# Patient Record
Sex: Female | Born: 1955
Health system: Southern US, Community
[De-identification: ages and names within clinical notes are randomized; demographics above are authoritative.]

## PROBLEM LIST (undated history)

## (undated) DIAGNOSIS — E119 Type 2 diabetes mellitus without complications: Secondary | ICD-10-CM

## (undated) DIAGNOSIS — E785 Hyperlipidemia, unspecified: Secondary | ICD-10-CM

## (undated) DIAGNOSIS — I219 Acute myocardial infarction, unspecified: Secondary | ICD-10-CM

## (undated) DIAGNOSIS — E079 Disorder of thyroid, unspecified: Secondary | ICD-10-CM

## (undated) DIAGNOSIS — F419 Anxiety disorder, unspecified: Secondary | ICD-10-CM

## (undated) DIAGNOSIS — G4700 Insomnia, unspecified: Secondary | ICD-10-CM

## (undated) DIAGNOSIS — I10 Essential (primary) hypertension: Secondary | ICD-10-CM

## (undated) HISTORY — DX: Acute myocardial infarction, unspecified: I21.9

## (undated) HISTORY — DX: Type 2 diabetes mellitus without complications: E11.9

## (undated) HISTORY — DX: Insomnia, unspecified: G47.00

## (undated) HISTORY — DX: Hyperlipidemia, unspecified: E78.5

## (undated) HISTORY — DX: Essential (primary) hypertension: I10

## (undated) HISTORY — PX: CARPAL TUNNEL RELEASE: SHX101

## (undated) HISTORY — DX: Anxiety disorder, unspecified: F41.9

## (undated) HISTORY — PX: CATARACT EXTRACTION: SUR2

## (undated) HISTORY — DX: Disorder of thyroid, unspecified: E07.9

---

## 1994-04-30 HISTORY — PX: TOTAL HIP ARTHROPLASTY: SHX124

## 2001-04-30 HISTORY — PX: ABDOMINAL HYSTERECTOMY: SHX81

## 2001-04-30 HISTORY — PX: LAPAROSCOPIC HYSTERECTOMY: SHX1926

## 2002-05-14 ENCOUNTER — Encounter: Admission: RE | Admit: 2002-05-14 | Discharge: 2002-05-14 | Payer: Self-pay | Admitting: Internal Medicine

## 2002-05-14 ENCOUNTER — Encounter: Payer: Self-pay | Admitting: Internal Medicine

## 2003-11-22 ENCOUNTER — Other Ambulatory Visit: Admission: RE | Admit: 2003-11-22 | Discharge: 2003-11-22 | Payer: Self-pay | Admitting: Obstetrics and Gynecology

## 2004-10-24 ENCOUNTER — Emergency Department (HOSPITAL_COMMUNITY): Admission: EM | Admit: 2004-10-24 | Discharge: 2004-10-24 | Payer: Self-pay | Admitting: Family Medicine

## 2005-11-26 ENCOUNTER — Encounter (HOSPITAL_COMMUNITY): Admission: RE | Admit: 2005-11-26 | Discharge: 2006-01-22 | Payer: Self-pay | Admitting: Family Medicine

## 2005-12-07 ENCOUNTER — Encounter: Admission: RE | Admit: 2005-12-07 | Discharge: 2005-12-07 | Payer: Self-pay | Admitting: Family Medicine

## 2006-01-15 ENCOUNTER — Encounter: Admission: RE | Admit: 2006-01-15 | Discharge: 2006-01-15 | Payer: Self-pay | Admitting: Family Medicine

## 2006-01-15 ENCOUNTER — Other Ambulatory Visit: Admission: RE | Admit: 2006-01-15 | Discharge: 2006-01-15 | Payer: Self-pay | Admitting: Interventional Radiology

## 2006-01-15 ENCOUNTER — Encounter (INDEPENDENT_AMBULATORY_CARE_PROVIDER_SITE_OTHER): Payer: Self-pay | Admitting: *Deleted

## 2007-06-06 ENCOUNTER — Encounter: Payer: Self-pay | Admitting: Endocrinology

## 2008-11-18 ENCOUNTER — Ambulatory Visit: Payer: Self-pay | Admitting: Emergency Medicine

## 2008-11-30 ENCOUNTER — Ambulatory Visit: Payer: Self-pay | Admitting: Emergency Medicine

## 2010-05-21 ENCOUNTER — Encounter: Payer: Self-pay | Admitting: Family Medicine

## 2013-01-19 ENCOUNTER — Encounter: Payer: Self-pay | Admitting: Obstetrics & Gynecology

## 2013-02-09 ENCOUNTER — Ambulatory Visit (INDEPENDENT_AMBULATORY_CARE_PROVIDER_SITE_OTHER): Admitting: Obstetrics & Gynecology

## 2013-02-09 ENCOUNTER — Encounter: Payer: Self-pay | Admitting: Obstetrics & Gynecology

## 2013-02-09 VITALS — BP 110/68 | HR 74 | Temp 97.5°F | Wt 135.0 lb

## 2013-02-09 DIAGNOSIS — Z0000 Encounter for general adult medical examination without abnormal findings: Secondary | ICD-10-CM

## 2013-02-09 DIAGNOSIS — R3915 Urgency of urination: Secondary | ICD-10-CM

## 2013-02-09 DIAGNOSIS — Z01419 Encounter for gynecological examination (general) (routine) without abnormal findings: Secondary | ICD-10-CM

## 2013-02-09 LAB — POCT URINALYSIS DIPSTICK
Glucose, UA: NEGATIVE
Leukocytes, UA: NEGATIVE
Nitrite, UA: NEGATIVE
Protein, UA: NEGATIVE
Spec Grav, UA: <=1.005
Urobilinogen, UA: NEGATIVE

## 2013-02-09 LAB — POCT WET PREP (WET MOUNT): Clue Cells Wet Prep Whiff POC: NEGATIVE

## 2013-02-09 MED ORDER — SULFAMETHOXAZOLE-TMP DS 800-160 MG PO TABS: 1.0000 | ORAL_TABLET | Freq: Two times a day (BID) | ORAL | Status: DC

## 2013-02-09 NOTE — Patient Instructions (Signed)
Urinary Tract Infection  Urinary tract infections (UTIs) can develop anywhere along your urinary tract. Your urinary tract is your body's drainage system for removing wastes and extra water. Your urinary tract includes two kidneys, two ureters, a bladder, and a urethra. Your kidneys are a pair of bean-shaped organs. Each kidney is about the size of your fist. They are located below your ribs, one on each side of your spine.  CAUSES  Infections are caused by microbes, which are microscopic organisms, including fungi, viruses, and bacteria. These organisms are so small that they can only be seen through a microscope. Bacteria are the microbes that most commonly cause UTIs.  SYMPTOMS   Symptoms of UTIs may vary by age and gender of the patient and by the location of the infection. Symptoms in young women typically include a frequent and intense urge to urinate and a painful, burning feeling in the bladder or urethra during urination. Older women and men are more likely to be tired, shaky, and weak and have muscle aches and abdominal pain. A fever may mean the infection is in your kidneys. Other symptoms of a kidney infection include pain in your back or sides below the ribs, nausea, and vomiting.  DIAGNOSIS  To diagnose a UTI, your caregiver will ask you about your symptoms. Your caregiver also will ask to provide a urine sample. The urine sample will be tested for bacteria and white blood cells. White blood cells are made by your body to help fight infection.  TREATMENT   Typically, UTIs can be treated with medication. Because most UTIs are caused by a bacterial infection, they usually can be treated with the use of antibiotics. The choice of antibiotic and length of treatment depend on your symptoms and the type of bacteria causing your infection.  HOME CARE INSTRUCTIONS   If you were prescribed antibiotics, take them exactly as your caregiver instructs you. Finish the medication even if you feel better after you  have only taken some of the medication.   Drink enough water and fluids to keep your urine clear or pale yellow.   Avoid caffeine, tea, and carbonated beverages. They tend to irritate your bladder.   Empty your bladder often. Avoid holding urine for long periods of time.   Empty your bladder before and after sexual intercourse.   After a bowel movement, women should cleanse from front to back. Use each tissue only once.  SEEK MEDICAL CARE IF:    You have back pain.   You develop a fever.   Your symptoms do not begin to resolve within 3 days.  SEEK IMMEDIATE MEDICAL CARE IF:    You have severe back pain or lower abdominal pain.   You develop chills.   You have nausea or vomiting.   You have continued burning or discomfort with urination.  MAKE SURE YOU:    Understand these instructions.   Will watch your condition.   Will get help right away if you are not doing well or get worse.  Document Released: 01/24/2005 Document Revised: 10/16/2011 Document Reviewed: 05/25/2011  [employer] Patient Information 2014 [employer].

## 2013-02-09 NOTE — Progress Notes (Signed)
Subjective:     Andrea Riley is a 58 y.o. female here for a routine exam.  Current complaints: annual exam. Pt states she is having a yellow crusty like discharge. Pt states she has a slight odor.   Personal health questionnaire reviewed: yes.   Gynecologic History No LMP recorded. Patient has had a hysterectomy. Contraception: status post hysterectomy Last Pap : 04/2012 Results were normal Last mammogram: 2013 . Results were: normal  Obstetric History OB History  No data available     The following portions of the patient's history were reviewed and updated as appropriate: allergies, current medications, past family history, past medical history, past social history, past surgical history and problem list.  Review of Systems Pertinent items are noted in HPI.    Objective:    General appearance: alert Breasts: normal appearance, no masses or tenderness Abdomen: soft, non-tender; bowel sounds normal; no masses,  no organomegaly Pelvic:  external genitalia normal, no adnexal masses or tenderness and vagina normal without discharge    Assessment:   UTI symptoms  Plan:   Treat empirically for a UTI  Testing for a possible vaginitis Return prn

## 2013-02-10 LAB — URINALYSIS, ROUTINE W REFLEX MICROSCOPIC
Glucose, UA: NEGATIVE mg/dL
Hgb urine dipstick: NEGATIVE
Leukocytes, UA: NEGATIVE
Nitrite: NEGATIVE
Protein, ur: NEGATIVE mg/dL
Urobilinogen, UA: 0.2 mg/dL (ref 0.0–1.0)

## 2013-02-12 ENCOUNTER — Encounter: Payer: Self-pay | Admitting: Obstetrics & Gynecology

## 2013-02-12 LAB — URINE CULTURE

## 2013-02-16 ENCOUNTER — Encounter: Payer: Self-pay | Admitting: Obstetrics

## 2013-02-16 ENCOUNTER — Encounter: Payer: Self-pay | Admitting: Obstetrics & Gynecology

## 2013-08-13 ENCOUNTER — Ambulatory Visit: Admitting: Obstetrics & Gynecology

## 2013-08-21 ENCOUNTER — Encounter: Payer: Self-pay | Admitting: Obstetrics & Gynecology

## 2014-04-26 ENCOUNTER — Encounter: Payer: Self-pay | Admitting: *Deleted

## 2014-04-27 ENCOUNTER — Encounter: Payer: Self-pay | Admitting: Obstetrics & Gynecology

## 2014-06-15 ENCOUNTER — Other Ambulatory Visit: Payer: Self-pay | Admitting: *Deleted

## 2014-06-15 DIAGNOSIS — Z1231 Encounter for screening mammogram for malignant neoplasm of breast: Secondary | ICD-10-CM

## 2016-12-19 ENCOUNTER — Encounter: Payer: Self-pay | Admitting: Gastroenterology

## 2017-01-24 ENCOUNTER — Other Ambulatory Visit: Payer: Self-pay

## 2017-02-07 ENCOUNTER — Ambulatory Visit (INDEPENDENT_AMBULATORY_CARE_PROVIDER_SITE_OTHER): Admitting: Gastroenterology

## 2017-02-07 ENCOUNTER — Encounter: Payer: Self-pay | Admitting: Gastroenterology

## 2017-02-07 VITALS — BP 120/68 | HR 64 | Ht 62.5 in | Wt 155.0 lb

## 2017-02-07 DIAGNOSIS — R140 Abdominal distension (gaseous): Secondary | ICD-10-CM

## 2017-02-07 DIAGNOSIS — R1031 Right lower quadrant pain: Secondary | ICD-10-CM

## 2017-02-07 DIAGNOSIS — Z800 Family history of malignant neoplasm of digestive organs: Secondary | ICD-10-CM | POA: Diagnosis not present

## 2017-02-07 MED ORDER — NA SULFATE-K SULFATE-MG SULF 17.5-3.13-1.6 GM/177ML PO SOLN: 1.0000 | Freq: Once | ORAL | 0 refills | Status: AC

## 2017-02-07 NOTE — Patient Instructions (Signed)
If you are age 63 or older, your body mass index should be between 23-30. Your Body mass index is 27.9 kg/m. If this is out of the aforementioned range listed, please consider follow up with your Primary Care Provider.  If you are age 16 or younger, your body mass index should be between 19-25. Your Body mass index is 27.9 kg/m. If this is out of the aformentioned range listed, please consider follow up with your Primary Care Provider.   You have been scheduled for an endoscopy and colonoscopy. Please follow the written instructions given to you at your visit today. Please pick up your prep supplies at the pharmacy within the next 1-3 days. If you use inhalers (even only as needed), please bring them with you on the day of your procedure. Your physician has requested that you go to www.startemmi.com and enter the access code given to you at your visit today. This web site gives a general overview about your procedure. However, you should still follow specific instructions given to you by our office regarding your preparation for the procedure.  Thank you for choosing [facility]  Dr Amada Jupiter III

## 2017-02-07 NOTE — Progress Notes (Signed)
[facility] Consult Note:  History: Andrea Riley 02/07/2017  Referring physician: Marva Panda, NP  Reason for consult/chief complaint: Abdominal Pain (LRQ with swelling and bloating)   Subjective  HPI:  This is a delightful 61 year old woman referred by primary care for right lower quadrant pain. It has been occurring for many years, more so in the last 3-5. She feels it as a brief acute onset sharp pain "like a knife" that might radiate slightly to the hip or the right flank. It might be worse with certain movements, and he gets better with Advil. She denies exacerbation by bowel movements or eating. She does tend toward abdominal bloating for many years, and consumes a high-fiber diet with additional fiber supplements. She has tried a lactose-free diet without much improvement in the bloating. Her bowel habits are regular without rectal bleeding.Sumaiyah recalls seeing a physician in [place] for this pain many years ago and was told she might have a hernia. She also believes her last colonoscopy was many years ago. There's been some concern on her part about the nature of the symptoms because her mother was diagnosed with stomach cancer at age 54. Kyannah believes her last gynecology visit was several years ago, and says that primary care does her Pap smears. She was not sure that she needed gynecology follow-up because she has had a hysterectomy and oophorectomy many years ago.  ROS:  Review of Systems  Constitutional: Negative for appetite change and unexpected weight change.  HENT: Negative for mouth sores and voice change.   Eyes: Negative for pain and redness.  Respiratory: Negative for cough and shortness of breath.   Cardiovascular: Negative for chest pain and palpitations.  Genitourinary: Negative for dysuria and hematuria.  Musculoskeletal: Negative for arthralgias and myalgias.  Skin: Negative for pallor and rash.  Neurological: Negative for  weakness and headaches.  Hematological: Negative for adenopathy.     Past Medical History: Past Medical History:  Diagnosis Date  . Anxiety   . Hyperlipemia   . Hypertension   . Insomnia   . Thyroid disease    Hypothyroidism     Past Surgical History: Past Surgical History:  Procedure Laterality Date  . ABDOMINAL HYSTERECTOMY  2003  . CARPAL TUNNEL RELEASE Bilateral 08/2011 & 04/2012  . CATARACT EXTRACTION    . CESAREAN SECTION    . TOTAL HIP ARTHROPLASTY Right 1996   She believes ovaries removed as well. Emergency C section  - "wasn't pretty"  Family History: Family History  Problem Relation Age of Onset  . Diabetes Mother   . Heart disease Mother   . Hypertension Mother   . Cancer Mother   . Thyroid disease Mother   . Hypertension Father     Social History: Social History   Social History  . Marital status: Married    Spouse name: N/A  . Number of children: N/A  . Years of education: N/A   Social History Main Topics  . Smoking status: Current Every Day Smoker    Packs/day: 0.25    Years: 24.00  . Smokeless tobacco: Never Used  . Alcohol use No  . Drug use: No  . Sexual activity: Not Currently   Other Topics Concern  . None   Social History Narrative  . None    Allergies: Allergies  Allergen Reactions  . Latex Itching  . Penicillins Swelling and Other (See Comments)    Itching  . Vicodin [Hydrocodone-Acetaminophen] Other (See Comments)    Hallucinations  Outpatient Meds: Current Outpatient Prescriptions  Medication Sig Dispense Refill  . ALPRAZolam (XANAX) 1 MG tablet Take 1 mg by mouth at bedtime as needed for sleep.    Marland Kitchen atorvastatin (LIPITOR) 20 MG tablet Take 1 tablet by mouth daily.    Marland Kitchen esomeprazole (NEXIUM) 40 MG capsule Take 40 mg by mouth daily before breakfast.    . sertraline (ZOLOFT) 50 MG tablet Take 50 mg by mouth daily.    Marland Kitchen sulfamethoxazole-trimethoprim (BACTRIM DS) 800-160 MG per tablet Take 1 tablet by mouth 2  (two) times daily. 14 tablet 0  . valsartan-hydrochlorothiazide (DIOVAN HCT) 80-12.5 MG per tablet Take 1 tablet by mouth daily.    Marland Kitchen zolpidem (AMBIEN) 10 MG tablet Take 10 mg by mouth at bedtime as needed for sleep.     No current facility-administered medications for this visit.       ___________________________________________________________________ Objective   Exam:  BP 120/68   Pulse 64   Ht 5' 2.5" (1.588 m)   Wt 155 lb (70.3 kg)   BMI 27.90 kg/m    General: this is a(n) well-appearing woman   Eyes: sclera anicteric, no redness  ENT: oral mucosa moist without lesions, no cervical or supraclavicular lymphadenopathy, good dentition  CV: RRR without murmur, S1/S2, no JVD, no peripheral edema  Resp: clear to auscultation bilaterally, normal RR and effort noted  GI: soft, no tenderness, with active bowel sounds. No guarding or palpable organomegaly noted.there is no abdominal wall hernia supine or standing  Skin; warm and dry, no rash or jaundice noted  Neuro: awake, alert and oriented x 3. Normal gross motor function and fluent speech  Labs:  August nml UA, CMP, CBC/diff  No radiologic evaluation  Assessment: Encounter Diagnoses  Name Primary?  . RLQ abdominal pain Yes  . Abdominal bloating   . Family history- stomach cancer     The pain is somewhat difficult to characterize. I am not certain if it is really GI in origin given her description. She wonders if it could be adhesions from prior surgery, but I do not think we have any way of knowing that with certainty.She has had no history of bowel obstruction, and the pain does not have associated nausea or vomiting. I reassured her that this pain is not consistent with gastric cancer. Nevertheless her family history warrants an upper endoscopy to screen for intestinal metaplasia or H. Pylori as possible risk factors for gastric cancer.  Plan:  EGD and colonoscopy, she is agreeable.  The benefits and risks  of the planned procedure were described in detail with the patient or (when appropriate) their health care proxy.  Risks were outlined as including, but not limited to, bleeding, infection, perforation, adverse medication reaction leading to cardiac or pulmonary decompensation, or pancreatitis (if ERCP).  The limitation of incomplete mucosal visualization was also discussed.  No guarantees or warranties were given.   Kita feels that if it can be determined that the symptoms were not harmful cause, she would be reassured and could definitely live with it.  Thank you for the courtesy of this consult.  Please call me with any questions or concerns.  Charlie Pitter III  CC: Marva Panda, NP

## 2017-03-11 ENCOUNTER — Ambulatory Visit (AMBULATORY_SURGERY_CENTER): Admitting: Gastroenterology

## 2017-03-11 ENCOUNTER — Encounter: Payer: Self-pay | Admitting: Gastroenterology

## 2017-03-11 ENCOUNTER — Other Ambulatory Visit: Payer: Self-pay

## 2017-03-11 VITALS — BP 137/66 | HR 62 | Temp 96.8°F | Resp 14 | Ht 62.5 in | Wt 155.0 lb

## 2017-03-11 DIAGNOSIS — Z800 Family history of malignant neoplasm of digestive organs: Secondary | ICD-10-CM | POA: Diagnosis not present

## 2017-03-11 DIAGNOSIS — R1031 Right lower quadrant pain: Secondary | ICD-10-CM | POA: Diagnosis not present

## 2017-03-11 MED ORDER — SODIUM CHLORIDE 0.9 % IV SOLN: 500.0000 mL | INTRAVENOUS | Status: DC

## 2017-03-11 NOTE — Patient Instructions (Signed)
YOU HAD AN ENDOSCOPIC PROCEDURE TODAY AT THE [facility]:   Refer to the procedure report that was given to you for any specific questions about what was found during the examination.  If the procedure report does not answer your questions, please call your gastroenterologist to clarify.  If you requested that your care partner not be given the details of your procedure findings, then the procedure report has been included in a sealed envelope for you to review at your convenience later.  YOU SHOULD EXPECT: Some feelings of bloating in the abdomen. Passage of more gas than usual.  Walking can help get rid of the air that was put into your GI tract during the procedure and reduce the bloating. If you had a lower endoscopy (such as a colonoscopy or flexible sigmoidoscopy) you may notice spotting of blood in your stool or on the toilet paper. If you underwent a bowel prep for your procedure, you may not have a normal bowel movement for a few days.  Please Note:  You might notice some irritation and congestion in your nose or some drainage.  This is from the oxygen used during your procedure.  There is no need for concern and it should clear up in a day or so.  SYMPTOMS TO REPORT IMMEDIATELY:   Following lower endoscopy (colonoscopy or flexible sigmoidoscopy):  Excessive amounts of blood in the stool  Significant tenderness or worsening of abdominal pains  Swelling of the abdomen that is new, acute  Fever of 100F or higher   Following upper endoscopy (EGD)  Vomiting of blood or coffee ground material  New chest pain or pain under the shoulder blades  Painful or persistently difficult swallowing  New shortness of breath  Fever of 100F or higher  Black, tarry-looking stools  For urgent or emergent issues, a gastroenterologist can be reached at any hour by calling [phone].   DIET:  We do recommend a small meal at first, but then you may proceed to your regular diet.  Drink  plenty of fluids but you should avoid alcoholic beverages for 24 hours.  ACTIVITY:  You should plan to take it easy for the rest of today and you should NOT DRIVE or use heavy machinery until tomorrow (because of the sedation medicines used during the test).    FOLLOW UP: Our staff will call the number listed on your records the next business day following your procedure to check on you and address any questions or concerns that you may have regarding the information given to you following your procedure. If we do not reach you, we will leave a message.  However, if you are feeling well and you are not experiencing any problems, there is no need to return our call.  We will assume that you have returned to your regular daily activities without incident.  If any biopsies were taken you will be contacted by phone or by letter within the next 1-3 weeks.  Please call us at [phone] if you have not heard about the biopsies in 3 weeks.   Await for results Repeat Colonoscopy screening in 10 years Diverticulosis (handout given)   SIGNATURES/CONFIDENTIALITY: You and/or your care partner have signed paperwork which will be entered into your electronic medical record.  These signatures attest to the fact that that the information above on your After Visit Summary has been reviewed and is understood.  Full responsibility of the confidentiality of this discharge information lies with you and/or your  care-partner.

## 2017-03-11 NOTE — Progress Notes (Signed)
Dental advisory given to patient 

## 2017-03-11 NOTE — Op Note (Signed)
[facility] Patient Name: Andrea DadaSabrina Crawshaw Procedure Date: 03/11/2017 1:50 PM MRN: [id number] Endoscopist: Sherilyn CooterHenry L. Myrtie Neitheranis , MD Age: [age] Referring MD:  Date of Birth: July 23, 1955 Gender: Female Account #: [id number] Procedure:                Upper GI endoscopy Indications:              Screening procedure (mother had gastric cancer) Medicines:                Monitored Anesthesia Care Procedure:                Pre-Anesthesia Assessment:                           - Prior to the procedure, a History and Physical                            was performed, and patient medications and                            allergies were reviewed. The patient's tolerance of                            previous anesthesia was also reviewed. The risks                            and benefits of the procedure and the sedation                            options and risks were discussed with the patient.                            All questions were answered, and informed consent                            was obtained. Prior Anticoagulants: The patient has                            taken no previous anticoagulant or antiplatelet                            agents. ASA Grade Assessment: II - A patient with                            mild systemic disease. After reviewing the risks                            and benefits, the patient was deemed in                            satisfactory condition to undergo the procedure.                           After obtaining informed consent, the endoscope was  passed under direct vision. Throughout the                            procedure, the patient's blood pressure, pulse, and                            oxygen saturations were monitored continuously. The                            Endoscope was introduced through the mouth, and                            advanced to the second part of duodenum. The upper                            GI  endoscopy was accomplished without difficulty.                            The patient tolerated the procedure well. Scope In: Scope Out: Findings:                 The esophagus was normal.                           The entire examined stomach was normal. Biopsies                            were taken with a cold forceps for Helicobacter                            pylori testing using CLOtest.                           The cardia and gastric fundus were normal on                            retroflexion.                           The examined duodenum was normal. Complications:            No immediate complications. Estimated Blood Loss:     Estimated blood loss: none. Impression:               - Normal esophagus.                           - Normal stomach. Biopsied.                           - Normal examined duodenum. Recommendation:           - Patient has a contact number available for                            emergencies. The signs and symptoms of potential  delayed complications were discussed with the                            patient. Return to normal activities tomorrow.                            Written discharge instructions were provided to the                            patient.                           - Resume previous diet.                           - Continue present medications.                           - Await pathology results.                           - See the other procedure note for documentation of                            additional recommendations. Elian Gloster L. Myrtie Neither, MD 03/11/2017 2:33:48 PM This report has been signed electronically.

## 2017-03-11 NOTE — Progress Notes (Signed)
Called to room to assist during endoscopic procedure.  Patient ID and intended procedure confirmed with present staff. Received instructions for my participation in the procedure from the performing physician.  

## 2017-03-11 NOTE — Op Note (Signed)
[facility] Patient Name: Andrea DadaSabrina Simson Procedure Date: 03/11/2017 1:50 PM MRN: [id number] Endoscopist: Sherilyn CooterHenry L. Myrtie Neitheranis , MD Age: 61 Referring MD:  Date of Birth: Aug 19, 1955 Gender: Female Account #: [id number] Procedure:                Colonoscopy Indications:              Abdominal pain in the right lower quadrant Medicines:                Monitored Anesthesia Care Procedure:                Pre-Anesthesia Assessment:                           - Prior to the procedure, a History and Physical                            was performed, and patient medications and                            allergies were reviewed. The patient's tolerance of                            previous anesthesia was also reviewed. The risks                            and benefits of the procedure and the sedation                            options and risks were discussed with the patient.                            All questions were answered, and informed consent                            was obtained. Prior Anticoagulants: The patient has                            taken no previous anticoagulant or antiplatelet                            agents. ASA Grade Assessment: II - A patient with                            mild systemic disease. After reviewing the risks                            and benefits, the patient was deemed in                            satisfactory condition to undergo the procedure.                           After obtaining informed consent, the colonoscope  was passed under direct vision. Throughout the                            procedure, the patient's blood pressure, pulse, and                            oxygen saturations were monitored continuously. The                            Colonoscope was introduced through the anus and                            advanced to the the cecum, identified by                            appendiceal orifice and  ileocecal valve. The                            colonoscopy was performed with moderate difficulty                            due to significant looping and a tortuous colon.                            Successful completion of the procedure was aided by                            changing the patient to a supine position and using                            manual pressure. The patient tolerated the                            procedure well. The quality of the bowel                            preparation was evaluated using the BBPS [facility]                            Bowel Preparation Scale) with scores of: Right                            Colon = 3, Transverse Colon = 3 and Left Colon = 3                            (entire mucosa seen well with no residual staining,                            small fragments of stool or opaque liquid). The                            total BBPS score equals 9. The bowel preparation  used was SUPREP. The ileocecal valve, appendiceal                            orifice, and rectum were photographed. The quality                            of the bowel preparation was excellent. [Anatomical                            Structures]. Scope In: 2:06:00 PM Scope Out: 2:25:38 PM Scope Withdrawal Time: 0 hours 7 minutes 4 seconds  Total Procedure Duration: 0 hours 19 minutes 38 seconds  Findings:                 The perianal and digital rectal examinations were                            normal.                           Multiple diverticula were found in the sigmoid                            colon.                           The sigmoid colon was moderately redundant.                           Retroflexion in the rectum was not performed due to                            anatomy.                           The exam was otherwise without abnormality. Complications:            No immediate complications. Estimated Blood Loss:     Estimated  blood loss: none. Impression:               - Diverticulosis in the sigmoid colon.                           - Redundant colon.                           - The examination was otherwise normal.                           - No specimens collected.                           No cause seen for the episodic brief, sharp pain                            reported. May be referred from right hip or related  to adhesions from prior surgery. Recommendation:           - Patient has a contact number available for                            emergencies. The signs and symptoms of potential                            delayed complications were discussed with the                            patient. Return to normal activities tomorrow.                            Written discharge instructions were provided to the                            patient.                           - Resume previous diet.                           - Continue present medications.                           - Repeat colonoscopy in 10 years for screening                            purposes. Daviel Allegretto L. Myrtie Neitheranis, MD 03/11/2017 2:37:53 PM This report has been signed electronically.

## 2017-03-12 ENCOUNTER — Telehealth: Payer: Self-pay

## 2017-03-12 LAB — HELICOBACTER PYLORI SCREEN-BIOPSY: UREASE: NEGATIVE

## 2017-03-12 NOTE — Telephone Encounter (Signed)
  Follow up Call-  Call back number 03/11/2017  Post procedure Call Back phone  # [phone]  Permission to leave phone message No  Some recent data might be hidden     Patient questions:  Do you have a fever, pain , or abdominal swelling? No. Pain Score  0 *  Have you tolerated food without any problems? Yes.    Have you been able to return to your normal activities? Yes.    Do you have any questions about your discharge instructions: Diet   No. Medications  No. Follow up visit  No.  Do you have questions or concerns about your Care? No.  Actions: * If pain score is 4 or above: No action needed, pain <4.

## 2017-03-14 ENCOUNTER — Encounter: Payer: Self-pay | Admitting: Gastroenterology

## 2018-05-26 ENCOUNTER — Other Ambulatory Visit: Payer: Self-pay | Admitting: *Deleted

## 2018-05-26 DIAGNOSIS — Z1231 Encounter for screening mammogram for malignant neoplasm of breast: Secondary | ICD-10-CM

## 2018-06-25 ENCOUNTER — Ambulatory Visit
Admission: RE | Admit: 2018-06-25 | Discharge: 2018-06-25 | Disposition: A | Discharge status: Home / Self Care | Source: Ambulatory Visit | Attending: *Deleted | Admitting: *Deleted

## 2018-06-25 DIAGNOSIS — Z1231 Encounter for screening mammogram for malignant neoplasm of breast: Secondary | ICD-10-CM

## 2018-06-26 ENCOUNTER — Other Ambulatory Visit: Payer: Self-pay | Admitting: *Deleted

## 2018-06-26 DIAGNOSIS — R928 Other abnormal and inconclusive findings on diagnostic imaging of breast: Secondary | ICD-10-CM

## 2018-07-02 ENCOUNTER — Ambulatory Visit

## 2018-07-02 ENCOUNTER — Ambulatory Visit
Admission: RE | Admit: 2018-07-02 | Discharge: 2018-07-02 | Disposition: A | Discharge status: Home / Self Care | Source: Ambulatory Visit | Attending: *Deleted | Admitting: *Deleted

## 2018-07-02 DIAGNOSIS — R928 Other abnormal and inconclusive findings on diagnostic imaging of breast: Secondary | ICD-10-CM

## 2019-06-12 ENCOUNTER — Other Ambulatory Visit: Payer: Self-pay | Admitting: *Deleted

## 2019-06-12 DIAGNOSIS — M810 Age-related osteoporosis without current pathological fracture: Secondary | ICD-10-CM

## 2019-09-04 ENCOUNTER — Other Ambulatory Visit: Payer: Self-pay | Admitting: *Deleted

## 2019-09-04 DIAGNOSIS — Z1231 Encounter for screening mammogram for malignant neoplasm of breast: Secondary | ICD-10-CM

## 2019-09-04 DIAGNOSIS — Z13820 Encounter for screening for osteoporosis: Secondary | ICD-10-CM

## 2019-09-04 DIAGNOSIS — E2839 Other primary ovarian failure: Secondary | ICD-10-CM

## 2019-11-25 ENCOUNTER — Other Ambulatory Visit: Payer: Self-pay

## 2019-11-25 ENCOUNTER — Ambulatory Visit
Admission: RE | Admit: 2019-11-25 | Discharge: 2019-11-25 | Disposition: A | Discharge status: Home / Self Care | Payer: Medicare Other | Source: Ambulatory Visit | Attending: *Deleted | Admitting: *Deleted

## 2019-11-25 DIAGNOSIS — E2839 Other primary ovarian failure: Secondary | ICD-10-CM

## 2019-11-25 DIAGNOSIS — Z1231 Encounter for screening mammogram for malignant neoplasm of breast: Secondary | ICD-10-CM

## 2019-12-30 ENCOUNTER — Other Ambulatory Visit: Payer: Medicare Other

## 2019-12-30 ENCOUNTER — Ambulatory Visit (INDEPENDENT_AMBULATORY_CARE_PROVIDER_SITE_OTHER): Payer: Medicare Other | Admitting: Gastroenterology

## 2019-12-30 ENCOUNTER — Encounter: Payer: Self-pay | Admitting: Gastroenterology

## 2019-12-30 VITALS — BP 118/60 | HR 71 | Ht 62.5 in | Wt 153.0 lb

## 2019-12-30 DIAGNOSIS — R1031 Right lower quadrant pain: Secondary | ICD-10-CM

## 2019-12-30 DIAGNOSIS — R1011 Right upper quadrant pain: Secondary | ICD-10-CM | POA: Diagnosis not present

## 2019-12-30 NOTE — Progress Notes (Signed)
[facility] Progress Note  Chief Complaint: Right lower and upper quadrant abdominal pain  Subjective  History:  Office visit and EGD/colonoscopy Nov 2018 for similar symptoms.  Colonoscopy notable for diverticulosis and tortuous, redundant colon.  Otherwise normal.  EGD for history of gastric cancer in her mother was normal, CLOtest negative.  Kaelei wanted to see me for reevaluation of the symptoms.  While the right lower quadrant pain is still episodic and usually dull, nonradiating with no clear relation to bowel movements eating or position, she is concerned because sometimes that discomfort feels like it extends into the right upper quadrant around the liver.  She says she can "feel something moving" under her rib cage.  She has episodic nausea.  Generally appetite is good and weight stable.  She still harbors concerns because of her mother's history of stomach cancer. The right upper quadrant discomfort is worse if she bends over, given her concerns that she might have a hernia.  She seem to recall that a physician in [place] told her she might have a hernia.  ROS: Cardiovascular:  no chest pain Respiratory: no dyspnea Mood stable on medicine Remainder systems negative except as above  The patient's Past Medical, Family and Social History were reviewed and are on file in the EMR.  Objective:  Med list reviewed  Current Outpatient Medications:  .  ALPRAZolam (XANAX) 1 MG tablet, Take 1 mg by mouth at bedtime as needed for sleep., Disp: , Rfl:  .  atorvastatin (LIPITOR) 20 MG tablet, Take 1 tablet by mouth daily., Disp: , Rfl:  .  esomeprazole (NEXIUM) 20 MG capsule, Take 40 mg by mouth daily at 12 noon. Sometimes takes a extra dose around 3pm, Disp: , Rfl:  .  fenofibrate (TRICOR) 145 MG tablet, Take 1 tablet by mouth daily., Disp: , Rfl:  .  sertraline (ZOLOFT) 50 MG tablet, Take 50 mg by mouth daily., Disp: , Rfl:  .  valsartan-hydrochlorothiazide (DIOVAN HCT)  80-12.5 MG per tablet, Take 1 tablet by mouth daily., Disp: , Rfl:    Vital signs in last 24 hrs: Vitals:   12/30/19 1055  BP: 118/60  Pulse: 71    Physical Exam  She is well-appearing, pleasant  HEENT: sclera anicteric, oral mucosa moist without lesions  Neck: supple, no thyromegaly, JVD or lymphadenopathy  Cardiac: RRR without murmurs, S1S2 heard, no peripheral edema  Pulm: clear to auscultation bilaterally, normal RR and effort noted  Abdomen: soft, no tenderness, with active bowel sounds. No guarding or palpable hepatosplenomegaly.  No visible or palpable abnormality right upper quadrant or right lower anterior/lateral chest wall  Skin; warm and dry, no jaundice or rash No visible or palpable abdominal wall hernias while standing or supine. Labs:   ___________________________________________ Radiologic studies:   ____________________________________________ Other:   _____________________________________________ Assessment & Plan  Assessment: Encounter Diagnoses  Name Primary?  . RUQ abdominal pain Yes  . Abdominal pain, RLQ    Pain is still not clearly digestive, possibly musculoskeletal. She has ongoing concerns about it, at least in part related to family history of malignancy.   Plan: I offered her a CT scan abdomen and pelvis for further evaluation (ultrasound would not be a detailed enough imaging study to answer this question).   She was enthusiastic about doing that, and felt it would give her great reassurance.   30 minutes were spent on this encounter (including chart review, history/exam, counseling/coordination of care, and documentation)  Charlie Pitter III

## 2019-12-30 NOTE — Patient Instructions (Signed)
If you are age 66 or older, your body mass index should be between 23-30. Your Body mass index is 27.54 kg/m. If this is out of the aforementioned range listed, please consider follow up with your Primary Care Provider.  If you are age 14 or younger, your body mass index should be between 19-25. Your Body mass index is 27.54 kg/m. If this is out of the aformentioned range listed, please consider follow up with your Primary Care Provider.   Your provider has requested that you go to the basement level for lab work before leaving today. Press "B" on the elevator. The lab is located at the first door on the left as you exit the elevator.  Due to recent changes in healthcare laws, you may see the results of your imaging and laboratory studies on MyChart before your provider has had a chance to review them.  We understand that in some cases there may be results that are confusing or concerning to you. Not all laboratory results come back in the same time frame and the provider may be waiting for multiple results in order to interpret others.  Please give [place] 48 hours in order for your provider to thoroughly review all the results before contacting the office for clarification of your results.    You have been scheduled for a CT scan of the abdomen and pelvis at [facility], 1st floor Radiology. You are scheduled on 12-11-2019  at [place] should arrive 15 minutes prior to your appointment time for registration.  Please pick up 2 bottles of contrast from [facility] at least 3 days prior to your scan, but today if possible. The solution may taste better if refrigerated, but do NOT add ice or any other liquid to this solution. Shake well before drinking.   Please follow the written instructions below on the day of your exam:   1) Do not eat anything after 4am (4 hours prior to your test)   2) Drink 1 bottle of contrast @ 6am (2 hours prior to your exam)  Remember to shake well before drinking and do  NOT pour over ice.     Drink 1 bottle of contrast @ 7am (1 hour prior to your exam)   You may take any medications as prescribed with a small amount of water, if necessary. If you take any of the following medications: METFORMIN, GLUCOPHAGE, GLUCOVANCE, AVANDAMET, RIOMET, FORTAMET, [place] MET, JANUMET, GLUMETZA or METAGLIP, you MAY be asked to HOLD this medication 48 hours AFTER the exam.   The purpose of you drinking the oral contrast is to aid in the visualization of your intestinal tract. The contrast solution may cause some diarrhea. Depending on your individual set of symptoms, you may also receive an intravenous injection of x-ray contrast/dye. Plan on being at [facility] for 45 minutes or longer, depending on the type of exam you are having performed.   If you have any questions regarding your exam or if you need to reschedule, you may call [facility] at [phone] between the hours of 8:00 am and 5:00 pm, Monday-Friday.   It was a pleasure to see you today!  Dr. Loletha Carrow

## 2019-12-31 LAB — BUN/CREATININE RATIO
BUN/Creatinine Ratio: 15 (calc) (ref 6–22)
BUN: 17 mg/dL (ref 7–25)
Creat: 1.1 mg/dL — ABNORMAL HIGH (ref 0.50–0.99)
GFR, Est African American: 61 mL/min/{1.73_m2} (ref >=60)
GFR, Est Non African American: 52 mL/min/{1.73_m2} — ABNORMAL LOW (ref >=60)

## 2020-01-11 ENCOUNTER — Ambulatory Visit (HOSPITAL_COMMUNITY)
Admission: RE | Admit: 2020-01-11 | Discharge: 2020-01-11 | Disposition: A | Discharge status: Home / Self Care | Payer: Medicare Other | Source: Ambulatory Visit | Attending: Gastroenterology | Admitting: Gastroenterology

## 2020-01-11 ENCOUNTER — Other Ambulatory Visit: Payer: Self-pay

## 2020-01-11 DIAGNOSIS — R1031 Right lower quadrant pain: Secondary | ICD-10-CM | POA: Insufficient documentation

## 2020-01-11 DIAGNOSIS — R1011 Right upper quadrant pain: Secondary | ICD-10-CM | POA: Diagnosis not present

## 2020-01-11 MED ORDER — IOHEXOL 300 MG/ML  SOLN
100.0000 mL | Freq: Once | INTRAMUSCULAR | Status: AC | PRN
  Administered 2020-01-11: 100 mL via INTRAVENOUS

## 2020-03-03 ENCOUNTER — Encounter (HOSPITAL_COMMUNITY)
Admission: EM | Disposition: A | Discharge status: Home / Self Care | Payer: Self-pay | Source: Home / Self Care | Attending: Cardiovascular Disease

## 2020-03-03 ENCOUNTER — Emergency Department (HOSPITAL_COMMUNITY): Payer: Medicare Other

## 2020-03-03 ENCOUNTER — Inpatient Hospital Stay (HOSPITAL_COMMUNITY)
Admission: EM | Admit: 2020-03-03 | Discharge: 2020-03-04 | DRG: 246 | Disposition: A | Discharge status: Home / Self Care | Payer: Medicare Other | Attending: Cardiovascular Disease | Admitting: Cardiovascular Disease

## 2020-03-03 ENCOUNTER — Other Ambulatory Visit (HOSPITAL_COMMUNITY): Payer: Medicare Other

## 2020-03-03 ENCOUNTER — Other Ambulatory Visit: Payer: Self-pay

## 2020-03-03 ENCOUNTER — Encounter (HOSPITAL_COMMUNITY): Payer: Self-pay

## 2020-03-03 DIAGNOSIS — N179 Acute kidney failure, unspecified: Secondary | ICD-10-CM | POA: Diagnosis present

## 2020-03-03 DIAGNOSIS — I2510 Atherosclerotic heart disease of native coronary artery without angina pectoris: Secondary | ICD-10-CM | POA: Diagnosis present

## 2020-03-03 DIAGNOSIS — E039 Hypothyroidism, unspecified: Secondary | ICD-10-CM | POA: Diagnosis present

## 2020-03-03 DIAGNOSIS — Z880 Allergy status to penicillin: Secondary | ICD-10-CM

## 2020-03-03 DIAGNOSIS — E1110 Type 2 diabetes mellitus with ketoacidosis without coma: Secondary | ICD-10-CM | POA: Diagnosis present

## 2020-03-03 DIAGNOSIS — I214 Non-ST elevation (NSTEMI) myocardial infarction: Secondary | ICD-10-CM | POA: Diagnosis present

## 2020-03-03 DIAGNOSIS — R7303 Prediabetes: Secondary | ICD-10-CM | POA: Diagnosis not present

## 2020-03-03 DIAGNOSIS — F17210 Nicotine dependence, cigarettes, uncomplicated: Secondary | ICD-10-CM | POA: Diagnosis present

## 2020-03-03 DIAGNOSIS — Z20822 Contact with and (suspected) exposure to covid-19: Secondary | ICD-10-CM | POA: Diagnosis present

## 2020-03-03 DIAGNOSIS — E785 Hyperlipidemia, unspecified: Secondary | ICD-10-CM

## 2020-03-03 DIAGNOSIS — Z955 Presence of coronary angioplasty implant and graft: Secondary | ICD-10-CM

## 2020-03-03 DIAGNOSIS — Z79899 Other long term (current) drug therapy: Secondary | ICD-10-CM

## 2020-03-03 DIAGNOSIS — Z8249 Family history of ischemic heart disease and other diseases of the circulatory system: Secondary | ICD-10-CM

## 2020-03-03 DIAGNOSIS — I2111 ST elevation (STEMI) myocardial infarction involving right coronary artery: Secondary | ICD-10-CM | POA: Diagnosis not present

## 2020-03-03 DIAGNOSIS — I10 Essential (primary) hypertension: Secondary | ICD-10-CM

## 2020-03-03 DIAGNOSIS — Z833 Family history of diabetes mellitus: Secondary | ICD-10-CM | POA: Diagnosis not present

## 2020-03-03 DIAGNOSIS — Z91040 Latex allergy status: Secondary | ICD-10-CM | POA: Diagnosis not present

## 2020-03-03 DIAGNOSIS — E782 Mixed hyperlipidemia: Secondary | ICD-10-CM

## 2020-03-03 DIAGNOSIS — Z885 Allergy status to narcotic agent status: Secondary | ICD-10-CM | POA: Diagnosis not present

## 2020-03-03 HISTORY — PX: INTRAVASCULAR ULTRASOUND/IVUS: CATH118244

## 2020-03-03 HISTORY — PX: CORONARY/GRAFT ACUTE MI REVASCULARIZATION: CATH118305

## 2020-03-03 HISTORY — PX: LEFT HEART CATH AND CORONARY ANGIOGRAPHY: CATH118249

## 2020-03-03 LAB — CBC
HCT: 36.7 % (ref 36.0–46.0)
Hemoglobin: 11.9 g/dL — ABNORMAL LOW (ref 12.0–15.0)
MCH: 29.7 pg (ref 26.0–34.0)
MCHC: 32.4 g/dL (ref 30.0–36.0)
MCV: 91.5 fL (ref 80.0–100.0)
Platelets: 352 10*3/uL (ref 150–400)
RBC: 4.01 MIL/uL (ref 3.87–5.11)
RDW: 14.6 % (ref 11.5–15.5)
WBC: 14.5 10*3/uL — ABNORMAL HIGH (ref 4.0–10.5)
nRBC: 0 % (ref 0.0–0.2)

## 2020-03-03 LAB — RESPIRATORY PANEL BY RT PCR (FLU A&B, COVID)
Influenza A by PCR: NEGATIVE
Influenza B by PCR: NEGATIVE
SARS Coronavirus 2 by RT PCR: NEGATIVE

## 2020-03-03 LAB — POCT ACTIVATED CLOTTING TIME
Activated Clotting Time: 279 seconds
Activated Clotting Time: 290 seconds

## 2020-03-03 LAB — BASIC METABOLIC PANEL
Anion gap: 13 (ref 5–15)
BUN: 17 mg/dL (ref 8–23)
CO2: 24 mmol/L (ref 22–32)
Calcium: 9.2 mg/dL (ref 8.9–10.3)
Chloride: 99 mmol/L (ref 98–111)
Creatinine, Ser: 1.33 mg/dL — ABNORMAL HIGH (ref 0.44–1.00)
GFR, Estimated: 44 mL/min — ABNORMAL LOW (ref >60)
Glucose, Bld: 195 mg/dL — ABNORMAL HIGH (ref 70–99)
Potassium: 4.1 mmol/L (ref 3.5–5.1)
Sodium: 136 mmol/L (ref 135–145)

## 2020-03-03 LAB — GLUCOSE, CAPILLARY: Glucose-Capillary: 132 mg/dL — ABNORMAL HIGH (ref 70–99)

## 2020-03-03 LAB — MRSA PCR SCREENING: MRSA by PCR: NEGATIVE

## 2020-03-03 LAB — CBG MONITORING, ED: Glucose-Capillary: 166 mg/dL — ABNORMAL HIGH (ref 70–99)

## 2020-03-03 LAB — TROPONIN I (HIGH SENSITIVITY): Troponin I (High Sensitivity): 120 ng/L (ref <18)

## 2020-03-03 SURGERY — CORONARY/GRAFT ACUTE MI REVASCULARIZATION
Anesthesia: LOCAL

## 2020-03-03 MED ORDER — FENTANYL CITRATE (PF) 100 MCG/2ML IJ SOLN
INTRAMUSCULAR | Status: DC | PRN
  Administered 2020-03-03 (×2): 25 ug via INTRAVENOUS

## 2020-03-03 MED ORDER — HEPARIN SODIUM (PORCINE) 1000 UNIT/ML IJ SOLN
INTRAMUSCULAR | Status: AC
  Filled 2020-03-03: qty 1

## 2020-03-03 MED ORDER — SODIUM CHLORIDE 0.9% FLUSH: 3.0000 mL | INTRAVENOUS | Status: DC | PRN

## 2020-03-03 MED ORDER — ATORVASTATIN CALCIUM 80 MG PO TABS
80.0000 mg | ORAL_TABLET | Freq: Every day | ORAL | Status: DC
  Administered 2020-03-04: 80 mg via ORAL
  Filled 2020-03-03: qty 1

## 2020-03-03 MED ORDER — SODIUM CHLORIDE 0.9 % IV SOLN: INTRAVENOUS | Status: AC

## 2020-03-03 MED ORDER — ASPIRIN 300 MG RE SUPP
300.0000 mg | RECTAL | Status: DC
  Filled 2020-03-03: qty 1

## 2020-03-03 MED ORDER — NITROGLYCERIN 1 MG/10 ML FOR IR/CATH LAB
INTRA_ARTERIAL | Status: AC
  Filled 2020-03-03: qty 10

## 2020-03-03 MED ORDER — LIDOCAINE HCL (PF) 1 % IJ SOLN
INTRAMUSCULAR | Status: DC | PRN
  Administered 2020-03-03: 5 mL

## 2020-03-03 MED ORDER — CHLORHEXIDINE GLUCONATE CLOTH 2 % EX PADS
6.0000 | MEDICATED_PAD | Freq: Every day | CUTANEOUS | Status: DC
  Administered 2020-03-04: 6 via TOPICAL

## 2020-03-03 MED ORDER — VALSARTAN-HYDROCHLOROTHIAZIDE 80-12.5 MG PO TABS: 1.0000 | ORAL_TABLET | Freq: Every day | ORAL | Status: DC

## 2020-03-03 MED ORDER — NITROGLYCERIN 0.4 MG SL SUBL: 0.4000 mg | SUBLINGUAL_TABLET | SUBLINGUAL | Status: DC | PRN

## 2020-03-03 MED ORDER — ONDANSETRON HCL 4 MG/2ML IJ SOLN: 4.0000 mg | Freq: Four times a day (QID) | INTRAMUSCULAR | Status: DC | PRN

## 2020-03-03 MED ORDER — ACETAMINOPHEN 325 MG PO TABS: 650.0000 mg | ORAL_TABLET | ORAL | Status: DC | PRN

## 2020-03-03 MED ORDER — VERAPAMIL HCL 2.5 MG/ML IV SOLN
INTRAVENOUS | Status: DC | PRN
  Administered 2020-03-03: 10 mL via INTRA_ARTERIAL

## 2020-03-03 MED ORDER — HEPARIN BOLUS VIA INFUSION
4000.0000 [IU] | Freq: Once | INTRAVENOUS | Status: AC
  Administered 2020-03-03: 4000 [IU] via INTRAVENOUS
  Filled 2020-03-03: qty 4000

## 2020-03-03 MED ORDER — SODIUM CHLORIDE 0.9 % WEIGHT BASED INFUSION: 1.0000 mL/kg/h | INTRAVENOUS | Status: DC

## 2020-03-03 MED ORDER — SODIUM CHLORIDE 0.9 % IV SOLN: 250.0000 mL | INTRAVENOUS | Status: DC | PRN

## 2020-03-03 MED ORDER — HEPARIN SODIUM (PORCINE) 1000 UNIT/ML IJ SOLN
INTRAMUSCULAR | Status: DC | PRN
  Administered 2020-03-03: 8000 [IU] via INTRAVENOUS
  Administered 2020-03-03: 2000 [IU] via INTRAVENOUS

## 2020-03-03 MED ORDER — MIDAZOLAM HCL 2 MG/2ML IJ SOLN
INTRAMUSCULAR | Status: AC
  Filled 2020-03-03: qty 2

## 2020-03-03 MED ORDER — FENTANYL CITRATE (PF) 100 MCG/2ML IJ SOLN
INTRAMUSCULAR | Status: AC
  Filled 2020-03-03: qty 2

## 2020-03-03 MED ORDER — ATORVASTATIN CALCIUM 10 MG PO TABS: 20.0000 mg | ORAL_TABLET | Freq: Every day | ORAL | Status: DC

## 2020-03-03 MED ORDER — NITROGLYCERIN 1 MG/10 ML FOR IR/CATH LAB
INTRA_ARTERIAL | Status: DC | PRN
  Administered 2020-03-03 (×2): 200 ug via INTRACORONARY

## 2020-03-03 MED ORDER — LIDOCAINE HCL (PF) 1 % IJ SOLN
INTRAMUSCULAR | Status: AC
  Filled 2020-03-03: qty 30

## 2020-03-03 MED ORDER — MIDAZOLAM HCL 2 MG/2ML IJ SOLN
INTRAMUSCULAR | Status: DC | PRN
  Administered 2020-03-03: 2 mg via INTRAVENOUS

## 2020-03-03 MED ORDER — ASPIRIN 81 MG PO CHEW
81.0000 mg | CHEWABLE_TABLET | Freq: Every day | ORAL | Status: DC
  Administered 2020-03-04: 81 mg via ORAL
  Filled 2020-03-03: qty 1

## 2020-03-03 MED ORDER — HEPARIN (PORCINE) 25000 UT/250ML-% IV SOLN
850.0000 [IU]/h | INTRAVENOUS | Status: DC
  Administered 2020-03-03: 850 [IU]/h via INTRAVENOUS
  Filled 2020-03-03: qty 250

## 2020-03-03 MED ORDER — TIROFIBAN (AGGRASTAT) BOLUS VIA INFUSION
INTRAVENOUS | Status: DC | PRN
  Administered 2020-03-03: 1725 ug via INTRAVENOUS

## 2020-03-03 MED ORDER — SODIUM CHLORIDE 0.9% FLUSH
3.0000 mL | Freq: Two times a day (BID) | INTRAVENOUS | Status: DC
  Administered 2020-03-04: 3 mL via INTRAVENOUS

## 2020-03-03 MED ORDER — ONDANSETRON HCL 4 MG/2ML IJ SOLN
4.0000 mg | Freq: Once | INTRAMUSCULAR | Status: AC
  Administered 2020-03-03: 4 mg via INTRAVENOUS
  Filled 2020-03-03: qty 2

## 2020-03-03 MED ORDER — TIROFIBAN HCL IN NACL 5-0.9 MG/100ML-% IV SOLN
INTRAVENOUS | Status: AC
  Filled 2020-03-03: qty 100

## 2020-03-03 MED ORDER — ASPIRIN EC 81 MG PO TBEC: 81.0000 mg | DELAYED_RELEASE_TABLET | Freq: Every day | ORAL | Status: DC

## 2020-03-03 MED ORDER — ASPIRIN 81 MG PO CHEW
324.0000 mg | CHEWABLE_TABLET | Freq: Once | ORAL | Status: AC
  Administered 2020-03-03: 324 mg via ORAL
  Filled 2020-03-03: qty 4

## 2020-03-03 MED ORDER — TICAGRELOR 90 MG PO TABS
ORAL_TABLET | ORAL | Status: AC
  Filled 2020-03-03: qty 2

## 2020-03-03 MED ORDER — HYDRALAZINE HCL 20 MG/ML IJ SOLN: 10.0000 mg | INTRAMUSCULAR | Status: AC | PRN

## 2020-03-03 MED ORDER — HEPARIN (PORCINE) IN NACL 1000-0.9 UT/500ML-% IV SOLN
INTRAVENOUS | Status: AC
  Filled 2020-03-03: qty 1000

## 2020-03-03 MED ORDER — SODIUM CHLORIDE 0.9 % WEIGHT BASED INFUSION: 3.0000 mL/kg/h | INTRAVENOUS | Status: AC

## 2020-03-03 MED ORDER — SODIUM CHLORIDE 0.9% FLUSH
3.0000 mL | Freq: Two times a day (BID) | INTRAVENOUS | Status: DC
  Administered 2020-03-03: 3 mL via INTRAVENOUS

## 2020-03-03 MED ORDER — TICAGRELOR 90 MG PO TABS
ORAL_TABLET | ORAL | Status: DC | PRN
  Administered 2020-03-03: 180 mg via ORAL

## 2020-03-03 MED ORDER — TIROFIBAN HCL IN NACL 5-0.9 MG/100ML-% IV SOLN
INTRAVENOUS | Status: DC | PRN
  Administered 2020-03-03: 0.075 ug/kg/min via INTRAVENOUS

## 2020-03-03 MED ORDER — IOHEXOL 350 MG/ML SOLN
INTRAVENOUS | Status: DC | PRN
  Administered 2020-03-03: 120 mL

## 2020-03-03 MED ORDER — TICAGRELOR 90 MG PO TABS
90.0000 mg | ORAL_TABLET | Freq: Two times a day (BID) | ORAL | Status: DC
  Administered 2020-03-03 – 2020-03-04 (×2): 90 mg via ORAL
  Filled 2020-03-03 (×2): qty 1

## 2020-03-03 MED ORDER — HEPARIN (PORCINE) IN NACL 1000-0.9 UT/500ML-% IV SOLN
INTRAVENOUS | Status: DC | PRN
  Administered 2020-03-03 (×2): 500 mL

## 2020-03-03 MED ORDER — METOPROLOL TARTRATE 12.5 MG HALF TABLET
12.5000 mg | ORAL_TABLET | Freq: Two times a day (BID) | ORAL | Status: DC
  Administered 2020-03-03 – 2020-03-04 (×2): 12.5 mg via ORAL
  Filled 2020-03-03 (×2): qty 1

## 2020-03-03 MED ORDER — VERAPAMIL HCL 2.5 MG/ML IV SOLN
INTRAVENOUS | Status: AC
  Filled 2020-03-03: qty 2

## 2020-03-03 MED ORDER — MORPHINE SULFATE (PF) 4 MG/ML IV SOLN
4.0000 mg | Freq: Once | INTRAVENOUS | Status: AC
  Administered 2020-03-03: 4 mg via INTRAVENOUS
  Filled 2020-03-03: qty 1

## 2020-03-03 MED ORDER — LIP MEDEX EX OINT
1.0000 "application " | TOPICAL_OINTMENT | CUTANEOUS | Status: DC | PRN
  Filled 2020-03-03 (×2): qty 7

## 2020-03-03 MED ORDER — ASPIRIN 81 MG PO CHEW: 324.0000 mg | CHEWABLE_TABLET | ORAL | Status: DC

## 2020-03-03 MED ORDER — LABETALOL HCL 5 MG/ML IV SOLN: 10.0000 mg | INTRAVENOUS | Status: AC | PRN

## 2020-03-03 SURGICAL SUPPLY — 26 items
BALLN SAPPHIRE 2.5X20 (BALLOONS) ×2
BALLN SAPPHIRE ~~LOC~~ 3.5X15 (BALLOONS) ×2 IMPLANT
BALLN ~~LOC~~ EMERGE MR 4.0X20 (BALLOONS) ×2
BALLOON SAPPHIRE 2.5X20 (BALLOONS) ×1 IMPLANT
BALLOON ~~LOC~~ EMERGE MR 4.0X20 (BALLOONS) ×1 IMPLANT
CATH 5FR JL3.5 JR4 ANG PIG MP (CATHETERS) ×2 IMPLANT
CATH EXTRAC PRONTO 5.5F 138CM (CATHETERS) ×2 IMPLANT
CATH LAUNCHER 6FR JR4 (CATHETERS) ×2 IMPLANT
CATH OPTICROSS HD (CATHETERS) ×2 IMPLANT
DEVICE RAD COMP TR BAND LRG (VASCULAR PRODUCTS) ×2 IMPLANT
ELECT DEFIB PAD ADLT CADENCE (PAD) ×2 IMPLANT
GLIDESHEATH SLEND SS 6F .021 (SHEATH) ×2 IMPLANT
GUIDEWIRE INQWIRE 1.5J.035X260 (WIRE) ×1 IMPLANT
INQWIRE 1.5J .035X260CM (WIRE) ×2
KIT ENCORE 26 ADVANTAGE (KITS) ×2 IMPLANT
KIT HEART LEFT (KITS) ×2 IMPLANT
KIT HEMO VALVE WATCHDOG (MISCELLANEOUS) ×2 IMPLANT
PACK CARDIAC CATHETERIZATION (CUSTOM PROCEDURE TRAY) ×2 IMPLANT
SHEATH 6FR 75 DEST SLENDER (SHEATH) ×2 IMPLANT
SLED PULL BACK IVUS (MISCELLANEOUS) ×2 IMPLANT
STENT SYNERGY XD 3.0X48 (Permanent Stent) ×1 IMPLANT
SYNERGY XD 3.0X48 (Permanent Stent) ×2 IMPLANT
TRANSDUCER W/STOPCOCK (MISCELLANEOUS) ×2 IMPLANT
TUBING CIL FLEX 10 FLL-RA (TUBING) ×2 IMPLANT
WIRE ASAHI GRAND SLAM 180CM (WIRE) ×2 IMPLANT
WIRE ASAHI PROWATER 180CM (WIRE) ×2 IMPLANT

## 2020-03-03 NOTE — Plan of Care (Signed)

## 2020-03-03 NOTE — ED Notes (Signed)
Repeat EKG per MD due to acute MI on first EKG. Patient resting in bed complaining of acid reflux feeling.

## 2020-03-03 NOTE — ED Notes (Signed)
Delay in triage/vitals due to pt requesting to use restroom first.

## 2020-03-03 NOTE — ED Notes (Signed)
Consent signed. [facility] in room for transfer to cath lab.

## 2020-03-03 NOTE — H&P (Addendum)
Cardiology Admission History and Physical:   Patient ID: MANVIR THORSON MRN: [id number]; DOB: 1955-07-17   Admission date: 03/03/2020  Primary Care Provider: Marva Panda, NP [facility] Cardiologist:New to [facility]  Chief Complaint:  Chest pain   Patient Profile:   Andrea Riley is a 64 y.o. female with a history of hypertension, hyperlipidemia, ongoing tobacco use and a family history of CAD who presented to [facility] with a month-long history of intermittent right and left breast pressure with exertional dyspnea and fatigue.   History of Present Illness:   Andrea Riley is a 64 year old female with a history as stated above who presented to [facility] 03/03/2020 with a month-long history of right and left breast pressure with exertional dyspnea and fatigue which worsened over the last 2 days.  Patient reports yesterday during the day she began having midsternal chest pain with radiation to her throat with some mucus buildup.  She assumed it was allergies and therefore took Mucinex to see if this would help.  She then went to sleep yesterday evening and woke up early this morning with left arm numbness and worsening midsternal chest pain again with radiation to her anterior throat with associated diaphoresis and nausea.  In route, she was given ASA by EMS.  On ED arrival, EKG performed which showed significant changes including ST elevation in leads I, II with T wave inversions in aVL.  Repeat EKG with progressive changes in anterior leads. hsT found to be elevated at 120 with suspicion this will continue to rise.  Creatinine mildly elevated at 1.33.  COVID-19 screening currently in process although no Covid symptoms. Cardiology was asked to evaluate.  On my assessment, patient reports she has no prior history of CAD however does have a history of CAD in her mother and sister.  Her mother had multiple MIs which began in her 22s and other cardiac history for which she cannot remember.  Sister reported to  have stenting in the past.  She continues to smoke 3 to 6 cigarettes/day.  Has a history of hypertension and hyperlipidemia.  Reports medication compliance.  Follows with urgent care for primary care concerns.  Past Medical History:  Diagnosis Date   Anxiety    Diabetes (HCC)    Hyperlipemia    Hypertension    Insomnia    Thyroid disease    Hypothyroidism    Past Surgical History:  Procedure Laterality Date   ABDOMINAL HYSTERECTOMY  2003   total   CARPAL TUNNEL RELEASE Bilateral 08/2011 & 04/2012   CATARACT EXTRACTION Bilateral    and lens replacement   CESAREAN SECTION     TOTAL HIP ARTHROPLASTY Right 1996     Medications Prior to Admission: Prior to Admission medications   Medication Sig Start Date End Date Taking? Authorizing Provider  ALPRAZolam Prudy Feeler) 1 MG tablet Take 1 mg by mouth at bedtime as needed for sleep.    [provider]  atorvastatin (LIPITOR) 20 MG tablet Take 1 tablet by mouth daily.    [provider]  esomeprazole (NEXIUM) 20 MG capsule Take 40 mg by mouth daily at 12 noon. Sometimes takes a extra dose around 3pm    [provider]  fenofibrate (TRICOR) 145 MG tablet Take 1 tablet by mouth daily.    [provider]  sertraline (ZOLOFT) 50 MG tablet Take 50 mg by mouth daily.    [provider]  valsartan-hydrochlorothiazide (DIOVAN HCT) 80-12.5 MG per tablet Take 1 tablet by mouth daily.  [provider]     Allergies:    Allergies  Allergen Reactions   Latex Itching   Penicillins Swelling and Other (See Comments)    Itching   Vicodin [Hydrocodone-Acetaminophen] Other (See Comments)    Hallucinations     Social History:   Social History   Socioeconomic History   Marital status: Married    Spouse name: Not on file   Number of children: 2   Years of education: Not on file   Highest education level: Not on file  Occupational History   Occupation: retired  Tobacco Use   Smoking status:  Current Every Day Smoker    Packs/day: 0.25    Years: 24.00    Pack years: 6.00    Types: Cigarettes   Smokeless tobacco: Never Used  Building services engineerVaping Use   Vaping Use: Never used  Substance and Sexual Activity   Alcohol use: No   Drug use: No   Sexual activity: Not Currently  Other Topics Concern   Not on file  Social History Narrative   Not on file   Social Determinants of Health   Financial Resource Strain:    Difficulty of Paying Living Expenses: Not on file  Food Insecurity:    Worried About Programme researcher, broadcasting/film/videounning Out of Food in the Last Year: Not on file   [employer] Out of Food in the Last Year: Not on file  Transportation Needs:    Lack of Transportation (Medical): Not on file   Lack of Transportation (Non-Medical): Not on file  Physical Activity:    Days of Exercise per Week: Not on file   Minutes of Exercise per Session: Not on file  Stress:    Feeling of Stress : Not on file  Social Connections:    Frequency of Communication with Friends and Family: Not on file   Frequency of Social Gatherings with Friends and Family: Not on file   Attends Religious Services: Not on file   Active Member of Clubs or Organizations: Not on file   Attends BankerClub or Organization Meetings: Not on file   Marital Status: Not on file  Intimate Partner Violence:    Fear of Current or Ex-Partner: Not on file   Emotionally Abused: Not on file   Physically Abused: Not on file   Sexually Abused: Not on file    Family History:   The patient's family history includes Diabetes in her mother; Heart disease in her mother; Hypertension in her father and mother; Stomach cancer in her maternal grandmother and mother; Thyroid disease in her mother. There is no history of Colon cancer, Esophageal cancer, or Rectal cancer.    ROS:  Please see the history of present illness.  All other ROS reviewed and negative.     Physical Exam/Data:   Vitals:   03/03/20 0920 03/03/20 0922 03/03/20 1020  BP:  (!) 172/78 (!) 145/91  Pulse:   (!) 54 (!) 59  Resp:  20 (!) 22  Temp:  (!) 97.4 F (36.3 C) 98 F (36.7 C)  TempSrc:  Oral   SpO2:  96% 100%  Weight: 69 kg    Height: 5\' 2"  (1.575 m)     No intake or output data in the 24 hours ending 03/03/20 1042 Last 3 Weights 03/03/2020 12/30/2019 03/11/2017  Weight (lbs) 152 lb 1.9 oz 153 lb 155 lb  Weight (kg) 69 kg 69.4 kg 70.308 kg     Body mass index is 27.82 kg/m.   General: Well developed, well nourished,  NAD Neck: Negative for carotid bruits. No JVD Lungs:Clear to ausculation bilaterally. No wheezes, rales, or rhonchi. Breathing is unlabored. Cardiovascular: RRR with S1 S2. No murmurs Abdomen: Soft, non-tender, non-distended. No obvious abdominal masses. Extremities: No edema. Radial pulses 2+ bilaterally Neuro: Alert and oriented. No focal deficits. No facial asymmetry. MAE spontaneously. Psych: Responds to questions appropriately with normal affect.     EKG:  The ECG that was done 03/03/20 was personally reviewed and demonstrates NSR with NSR and significant changes including ST elevation in leads I, II with T wave inversions in aVL.  Repeat EKG with progressive changes in anterior leads.  Relevant CV Studies:  None   Laboratory Data:  High Sensitivity Troponin:   Recent Labs  Lab 03/03/20 0934  TROPONINIHS 120*      Chemistry Recent Labs  Lab 03/03/20 0934  NA 136  K 4.1  CL 99  CO2 24  GLUCOSE 195*  BUN 17  CREATININE 1.33*  CALCIUM 9.2  GFRNONAA 44*  ANIONGAP 13    No results for input(s): PROT, ALBUMIN, AST, ALT, ALKPHOS, BILITOT in the last 168 hours. Hematology Recent Labs  Lab 03/03/20 0934  WBC 14.5*  RBC 4.01  HGB 11.9*  HCT 36.7  MCV 91.5  MCH 29.7  MCHC 32.4  RDW 14.6  PLT 352   BNPNo results for input(s): BNP, PROBNP in the last 168 hours.  DDimer No results for input(s): DDIMER in the last 168 hours.   Radiology/Studies:  No results found.  Assessment and Plan:   1. NSTEMI: -Patient presented with a month-long  history of right and left breast pressure with exertional dyspnea and fatigue which worsened over the last 2 days.  Patient reports yesterday during the day she began having midsternal chest pain with radiation to her throat with some mucus buildup.  She assumed it was allergies and therefore took Mucinex to see if this would help.  She then went to sleep yesterday evening and woke up early this morning with left arm numbness and worsening midsternal chest pain again with radiation to her anterior throat with associated diaphoresis and nausea.  -EKG performed which showed significant changes including ST elevation in leads I, II with T wave inversions in aVL. Repeat EKG with progressive changes in anterior leads. hsT found to be elevated at 120 with suspicion this will continue to rise.  Creatinine mildly elevated at 1.33.   -COVID-19 screening currently in process  -Plan is for urgent cardiac catheterization for further ischemic evaluation given EKG changes, elevated troponin, ongoing chest pain and CV risk factors including family history, hypertension, hyperlipidemia and tobacco use -Cardiac catheterization was discussed with the patient fully. The patient understands that risks include but are not limited to stroke (1 in 1000), death (1 in 1000), kidney failure [usually temporary] (1 in 500), bleeding (1 in 200), allergic reaction [possibly serious] (1 in 200).  The patient understands and is willing to proceed.  -Continue ASA, uptitrate atorvastatin based on LHC findings -Risk stratify with lipid panel, hemoglobin A1c -Start beta-blocker, carvedilol 3.125 mg twice daily -Heparin per pharmacy   2. HTN: -Elevated, 145/91, 172/78 -On PTA valsartan/hydrochlorothiazide 80/12.5 -Creatinine, 1.33 today -Will adjust antihypertensives in the post-cath setting -Add carvedilol  3. HLD: -No LDL on file -Currently on PTA atorvastatin 20, fenofibrate 145 -Reports primary care followed at an urgent care  center -Risk stratify with lipid panel  4. Tobacco Use: -Reports ongoing tobacco use with 3 to 6 cigarettes/day -Cessation strongly encouraged  Severity of Illness: The appropriate patient status for this patient is INPATIENT. Inpatient status is judged to be reasonable and necessary in order to provide the required intensity of service to ensure the patient's safety. The patient's presenting symptoms, physical exam findings, and initial radiographic and laboratory data in the context of their chronic comorbidities is felt to place them at high risk for further clinical deterioration. Furthermore, it is not anticipated that the patient will be medically stable for discharge from the hospital within 2 midnights of admission. The following factors support the patient status of inpatient.   " The patient's presenting symptoms include chest pain. " The worrisome physical exam findings include EKG changes " The initial radiographic and laboratory data are worrisome because of EKG changes. " The chronic co-morbidities include tobacco use, HTN, HLD and family hx of CAD.   * I certify that at the point of admission it is my clinical judgment that the patient will require inpatient hospital care spanning beyond 2 midnights from the point of admission due to high intensity of service, high risk for further deterioration and high frequency of surveillance required.*    For questions or updates, please contact [facility] Please consult [email or web address] for contact info under     Signed, Georgie Chard, NP  03/03/2020 10:42 AM   I have examined the patient and reviewed assessment and plan and discussed with patient.  Agree with above as stated.  I personally reviewed the ECG and made recommended emergent cath.  Subtle inferior ST segment changes consistent with injury.  Cath was difficult due to subclavian tortuosity which delayed door to device time.  Eventually, RCA stent was placed.  Patient did  well.  I expect a 2 day hospital stay.  COntinue DAPT, beta blocker statin for now.  Wen BP goes back up and renal function stable post cath, add back ARB.      Lance Muss

## 2020-03-03 NOTE — ED Triage Notes (Signed)
Patient states pain starting in the neck last night radiating to the mid chest. Patients states woke this am with pain radiating to left arm associated with shob and nausea.

## 2020-03-03 NOTE — ED Provider Notes (Signed)
[facility]-EMERGENCY DEPT Provider Note   CSN: [id number] Arrival date & time: 03/03/20  0906     History Chief Complaint  Patient presents with   Chest Pain    Andrea Riley is a 64 y.o. female.  Patient with history of hyperlipidemia, hypertension, smoking, family history of coronary artery disease (mother, younger sister) --presents to the emergency department for evaluation of left-sided chest pain with radiation to the neck.  Symptoms started about 9 PM last night.  No exertional component.  Patient states that she went to bed and woke up with numbness in her left arm.  She had shortness of breath with exertion and reported diaphoresis.  She had nausea but no vomiting.  No back pain or other extremity symptoms.  No lower extremity swelling.  No treatments prior to arrival.  Patient has not had pain like this in the past.  No previous stress tests or catheterizations per her report.  She has never had any problems with her heart or lungs.        Past Medical History:  Diagnosis Date   Anxiety    Diabetes (HCC)    Hyperlipemia    Hypertension    Insomnia    Thyroid disease    Hypothyroidism    There are no problems to display for this patient.   Past Surgical History:  Procedure Laterality Date   ABDOMINAL HYSTERECTOMY  2003   total   CARPAL TUNNEL RELEASE Bilateral 08/2011 & 04/2012   CATARACT EXTRACTION Bilateral    and lens replacement   CESAREAN SECTION     TOTAL HIP ARTHROPLASTY Right 1996     OB History   No obstetric history on file.     Family History  Problem Relation Age of Onset   Diabetes Mother    Heart disease Mother    Hypertension Mother    Thyroid disease Mother    Stomach cancer Mother    Hypertension Father    Stomach cancer Maternal Grandmother    Colon cancer Neg Hx    Esophageal cancer Neg Hx    Rectal cancer Neg Hx     Social History   Tobacco Use   Smoking status: Current  Every Day Smoker    Packs/day: 0.25    Years: 24.00    Pack years: 6.00    Types: Cigarettes   Smokeless tobacco: Never Used  Vaping Use   Vaping Use: Never used  Substance Use Topics   Alcohol use: No   Drug use: No    Home Medications Prior to Admission medications   Medication Sig Start Date End Date Taking? Authorizing Provider  ALPRAZolam Prudy Feeler) 1 MG tablet Take 1 mg by mouth at bedtime as needed for sleep.    [provider]  atorvastatin (LIPITOR) 20 MG tablet Take 1 tablet by mouth daily.    [provider]  esomeprazole (NEXIUM) 20 MG capsule Take 40 mg by mouth daily at 12 noon. Sometimes takes a extra dose around 3pm    [provider]  fenofibrate (TRICOR) 145 MG tablet Take 1 tablet by mouth daily.    [provider]  sertraline (ZOLOFT) 50 MG tablet Take 50 mg by mouth daily.    [provider]  valsartan-hydrochlorothiazide (DIOVAN HCT) 80-12.5 MG per tablet Take 1 tablet by mouth daily.    [provider]    Allergies    Latex, Penicillins, and Vicodin [hydrocodone-acetaminophen]  Review of Systems   Review of  Systems  Constitutional: Positive for diaphoresis. Negative for fever.  Eyes: Negative for redness.  Respiratory: Positive for shortness of breath. Negative for cough.   Cardiovascular: Positive for chest pain. Negative for palpitations and leg swelling.  Gastrointestinal: Positive for nausea. Negative for abdominal pain and vomiting.  Genitourinary: Negative for dysuria.  Musculoskeletal: Positive for neck pain. Negative for back pain.  Skin: Negative for rash.  Neurological: Negative for syncope and light-headedness.  Psychiatric/Behavioral: The patient is not nervous/anxious.     Physical Exam Updated Vital Signs BP (!) 172/78 (BP Location: Right Arm)    Pulse (!) 54    Temp (!) 97.4 F (36.3 C) (Oral)    Resp 20    Ht 5\' 2"  (1.575 m)    Wt 69 kg    SpO2 96%    BMI 27.82 kg/m   Physical  Exam Vitals and nursing note reviewed.  Constitutional:      General: She is in acute distress (apperas uncomfortable).     Appearance: She is well-developed. She is not diaphoretic.  HENT:     Head: Normocephalic and atraumatic.     Mouth/Throat:     Mouth: Mucous membranes are not dry.  Eyes:     Conjunctiva/sclera: Conjunctivae normal.  Neck:     Vascular: Normal carotid pulses. No carotid bruit or JVD.     Trachea: Trachea normal. No tracheal deviation.  Cardiovascular:     Rate and Rhythm: Normal rate and regular rhythm.     Pulses: No decreased pulses.     Heart sounds: Normal heart sounds, S1 normal and S2 normal. No murmur heard.   Pulmonary:     Effort: Pulmonary effort is normal. No respiratory distress.     Breath sounds: No wheezing.  Chest:     Chest wall: No tenderness.  Abdominal:     General: Bowel sounds are normal.     Palpations: Abdomen is soft.     Tenderness: There is no abdominal tenderness. There is no guarding or rebound.  Musculoskeletal:        General: Normal range of motion.     Cervical back: Normal range of motion and neck supple. No muscular tenderness.  Skin:    General: Skin is warm and dry.     Coloration: Skin is not pale.  Neurological:     Mental Status: She is alert.     ED Results / Procedures / Treatments   Labs (all labs ordered are listed, but only abnormal results are displayed) Labs Reviewed  BASIC METABOLIC PANEL - Abnormal; Notable for the following components:      Result Value   Glucose, Bld 195 (*)    Creatinine, Ser 1.33 (*)    GFR, Estimated 44 (*)    All other components within normal limits  CBC - Abnormal; Notable for the following components:   WBC 14.5 (*)    Hemoglobin 11.9 (*)    All other components within normal limits  CBG MONITORING, ED - Abnormal; Notable for the following components:   Glucose-Capillary 166 (*)    All other components within normal limits  TROPONIN I (HIGH SENSITIVITY) - Abnormal;  Notable for the following components:   Troponin I (High Sensitivity) 120 (*)    All other components within normal limits  RESPIRATORY PANEL BY RT PCR (FLU A&B, COVID)  HEPARIN LEVEL (UNFRACTIONATED)    EKG EKG Interpretation  Date/Time:  Thursday March 03 2020 09:52:46 EDT Ventricular Rate:  60 PR Interval:  QRS Duration: 90 QT Interval:  392 QTC Calculation: 392 R Axis:   64 Text Interpretation: Sinus or ectopic atrial rhythm Probable left atrial enlargement Posterior infarct, acute (LCx) Minimal ST elevation, inferior leads ST depression V1-V3, suggest recording posterior leads Confirmed by Meridee Score [phone]) on 03/03/2020 10:21:55 AM   Radiology No results found.  Procedures Procedures (including critical care time)  Medications Ordered in ED Medications  heparin ADULT infusion 100 units/mL (25000 units/264mL sodium chloride 0.45%) (850 Units/hr Intravenous New Bag/Given 03/03/20 1031)  aspirin chewable tablet 324 mg (324 mg Oral Given 03/03/20 0928)  morphine 4 MG/ML injection 4 mg (4 mg Intravenous Given 03/03/20 0955)  ondansetron (ZOFRAN) injection 4 mg (4 mg Intravenous Given 03/03/20 0955)  heparin bolus via infusion 4,000 Units (4,000 Units Intravenous Bolus from Bag 03/03/20 1035)    ED Course  I have reviewed the triage vital signs and the nursing notes.  Pertinent labs & imaging results that were available during my care of the patient were reviewed by me and considered in my medical decision making (see chart for details).  Patient seen and examined. Work-up initiated. Medications ordered. Story is concerning for ACS -- ASA ordered. Discussed with Dr. Charm Barges. EKG reviewed with t-wave changes, no old for comparison. No STEMI.   EKG reviewed. Concern for isolated elevation in III and depression, TWI in other leads. Dr. Charm Barges has reviewed and seen patient. Will consult with cardiology in regards to STEMI call.   Vital signs reviewed and are as  follows: BP (!) 172/78 (BP Location: Right Arm)    Pulse (!) 54    Temp (!) 97.4 F (36.3 C) (Oral)    Resp 20    Ht 5\' 2"  (1.575 m)    Wt 69 kg    SpO2 96%    BMI 27.82 kg/m   9:55 AM Dr. on phone with cardiology.   10:24 AM Cards to see patient, will need cath. Arrangements in progress to transport pt to cath lab at [facility].   Trop=120.   CRITICAL CARE Performed by: [facility] PA-C Total critical care time: 45 minutes Critical care time was exclusive of separately billable procedures and treating other patients. Critical care was necessary to treat or prevent imminent or life-threatening deterioration. Critical care was time spent personally by me on the following activities: development of treatment plan with patient and/or surrogate as well as nursing, discussions with consultants, evaluation of patient's response to treatment, examination of patient, obtaining history from patient or surrogate, ordering and performing treatments and interventions, ordering and review of laboratory studies, ordering and review of radiographic studies, pulse oximetry and re-evaluation of patient's condition.  10:46 AM En route to [facility].   Clinical Course as of Mar 03 1020  Thu Mar 03, 2020  2489 64 year old female history of hypertension hyperlipidemia diabetes and smoker here with substernal chest pain radiating to her neck that started last evening intermittently and was there this morning when she woke up. Associate with some nausea and some diaphoresis. No prior cardiac history. EKGs with significant ST depressions and possible inferior ST elevation in 1-lead. Called over to [facility] cardiology and they are now evaluate the EKG and have somebody come down to take a look at the patient.   [MB]  [phone] Per cards master Dr. [phone] does not think it meets STEMI criteria but they are recommending an urgent cath next case. Will get heparin going and needs a Covid swab.   [MB]  1012  Cards here to  evaluate patient   [MB]    Clinical Course User Index [MB] Terrilee FilesButler, Michael C, MD   MDM Rules/Calculators/A&P                          Admit, NSTEMI.   Final Clinical Impression(s) / ED Diagnoses Final diagnoses:  NSTEMI (non-ST elevated myocardial infarction) [facility](HCC)    Rx / DC Orders ED Discharge Orders    None       Renne CriglerGeiple, Lourine Alberico, PA-C 03/03/20 1047    Terrilee FilesButler, Michael C, MD 03/03/20 2140

## 2020-03-03 NOTE — Progress Notes (Signed)
ANTICOAGULATION CONSULT NOTE   Pharmacy Consult for heparin Indication: chest pain/ACS  Allergies  Allergen Reactions  . Latex Itching  . Penicillins Swelling and Other (See Comments)    Itching  . Vicodin [Hydrocodone-Acetaminophen] Other (See Comments)    Hallucinations     Patient Measurements: Height: 5\' 2"  (157.5 cm) Weight: 69 kg (152 lb 1.9 oz) IBW/kg (Calculated) : 50.1 Heparin Dosing Weight: 69 kg  Vital Signs: Temp: 97.4 F (36.3 C) (11/04 0922) Temp Source: Oral (11/04 0922) BP: 172/78 (11/04 0922) Pulse Rate: 54 (11/04 0922)  Labs: Recent Labs    03/03/20 0934  HGB 11.9*  HCT 36.7  PLT 352    CrCl cannot be calculated (Patient's most recent lab result is older than the maximum 21 days allowed.).   Assessment: Patient is a 64 y.o F presented to the ED on 11/4 with c/o CP.  Pharmacy is consulted to start heparin for ACS.   Goal of Therapy:  Heparin level 0.3-0.7 units/ml Monitor platelets by anticoagulation protocol: Yes   Plan:  - heparin 4000 units IV bolus x1, then 850 units/hr - check 6 hr heparin level - monitor for s/sx bleeding  Marshun Duva P 03/03/2020,10:10 AM

## 2020-03-03 NOTE — ED Notes (Signed)
PA in room

## 2020-03-03 NOTE — ED Notes (Signed)
Cardiology PA in room

## 2020-03-04 ENCOUNTER — Inpatient Hospital Stay (HOSPITAL_COMMUNITY): Payer: Medicare Other

## 2020-03-04 ENCOUNTER — Encounter (HOSPITAL_COMMUNITY): Payer: Self-pay | Admitting: Interventional Cardiology

## 2020-03-04 DIAGNOSIS — E785 Hyperlipidemia, unspecified: Secondary | ICD-10-CM

## 2020-03-04 DIAGNOSIS — E1110 Type 2 diabetes mellitus with ketoacidosis without coma: Secondary | ICD-10-CM

## 2020-03-04 DIAGNOSIS — I2111 ST elevation (STEMI) myocardial infarction involving right coronary artery: Secondary | ICD-10-CM | POA: Diagnosis not present

## 2020-03-04 DIAGNOSIS — I214 Non-ST elevation (NSTEMI) myocardial infarction: Secondary | ICD-10-CM | POA: Diagnosis not present

## 2020-03-04 LAB — BASIC METABOLIC PANEL
Anion gap: 10 (ref 5–15)
BUN: 12 mg/dL (ref 8–23)
CO2: 23 mmol/L (ref 22–32)
Calcium: 9.4 mg/dL (ref 8.9–10.3)
Chloride: 105 mmol/L (ref 98–111)
Creatinine, Ser: 1.06 mg/dL — ABNORMAL HIGH (ref 0.44–1.00)
GFR, Estimated: 58 mL/min — ABNORMAL LOW (ref >60)
Glucose, Bld: 107 mg/dL — ABNORMAL HIGH (ref 70–99)
Potassium: 3.8 mmol/L (ref 3.5–5.1)
Sodium: 138 mmol/L (ref 135–145)

## 2020-03-04 LAB — LIPID PANEL
Cholesterol: 144 mg/dL (ref 0–200)
HDL: 45 mg/dL (ref >40)
LDL Cholesterol: 80 mg/dL (ref 0–99)
Total CHOL/HDL Ratio: 3.2 RATIO
Triglycerides: 94 mg/dL (ref <150)
VLDL: 19 mg/dL (ref 0–40)

## 2020-03-04 LAB — ECHOCARDIOGRAM COMPLETE
Area-P 1/2: 3.72 cm2
Height: 62 in
S' Lateral: 2.7 cm
Weight: 2373.91 oz

## 2020-03-04 LAB — CBC
HCT: 33.1 % — ABNORMAL LOW (ref 36.0–46.0)
Hemoglobin: 11 g/dL — ABNORMAL LOW (ref 12.0–15.0)
MCH: 29.5 pg (ref 26.0–34.0)
MCHC: 33.2 g/dL (ref 30.0–36.0)
MCV: 88.7 fL (ref 80.0–100.0)
Platelets: 327 10*3/uL (ref 150–400)
RBC: 3.73 MIL/uL — ABNORMAL LOW (ref 3.87–5.11)
RDW: 14.7 % (ref 11.5–15.5)
WBC: 12.1 10*3/uL — ABNORMAL HIGH (ref 4.0–10.5)
nRBC: 0 % (ref 0.0–0.2)

## 2020-03-04 LAB — HEMOGLOBIN A1C
Hgb A1c MFr Bld: 6.5 % — ABNORMAL HIGH (ref 4.8–5.6)
Mean Plasma Glucose: 139.85 mg/dL

## 2020-03-04 LAB — GLUCOSE, CAPILLARY: Glucose-Capillary: 105 mg/dL — ABNORMAL HIGH (ref 70–99)

## 2020-03-04 MED ORDER — LISINOPRIL 5 MG PO TABS
5.0000 mg | ORAL_TABLET | Freq: Every day | ORAL | Status: DC
  Administered 2020-03-04: 5 mg via ORAL
  Filled 2020-03-04: qty 1

## 2020-03-04 MED ORDER — VALSARTAN-HYDROCHLOROTHIAZIDE 80-12.5 MG PO TABS
0.5000 | ORAL_TABLET | Freq: Every day | ORAL | Status: DC
Start: 2020-03-04 — End: 2022-03-19

## 2020-03-04 MED ORDER — INSULIN ASPART 100 UNIT/ML ~~LOC~~ SOLN: 0.0000 [IU] | Freq: Three times a day (TID) | SUBCUTANEOUS | Status: DC

## 2020-03-04 MED ORDER — NITROGLYCERIN 0.4 MG SL SUBL: 0.4000 mg | SUBLINGUAL_TABLET | SUBLINGUAL | 3 refills | Status: DC | PRN

## 2020-03-04 MED ORDER — TICAGRELOR 90 MG PO TABS
90.0000 mg | ORAL_TABLET | Freq: Two times a day (BID) | ORAL | 3 refills | Status: DC
Start: 2020-03-04 — End: 2020-05-30

## 2020-03-04 MED ORDER — ASPIRIN EC 81 MG PO TBEC: 81.0000 mg | DELAYED_RELEASE_TABLET | Freq: Every day | ORAL | 2 refills | Status: AC

## 2020-03-04 MED ORDER — METOPROLOL TARTRATE 25 MG PO TABS: 12.5000 mg | ORAL_TABLET | Freq: Two times a day (BID) | ORAL | 3 refills | Status: AC

## 2020-03-04 MED ORDER — ATORVASTATIN CALCIUM 80 MG PO TABS
80.0000 mg | ORAL_TABLET | Freq: Every day | ORAL | 3 refills | Status: DC
Start: 2020-03-05 — End: 2020-05-30

## 2020-03-04 MED FILL — BRILINTA 90 MG TABLET: 90 | 30 days supply | Qty: 60 | Fill #0

## 2020-03-04 MED FILL — ATORVASTATIN CALCIUM 80 MG: 80 | 90 days supply | Qty: 90 | Fill #0

## 2020-03-04 MED FILL — METOPROLOL TARTRATE 25 MG T: 25 | 90 days supply | Qty: 90 | Fill #0

## 2020-03-04 MED FILL — ASPIRIN LOW DOSE 81 MG TBEC: 81 | 90 days supply | Qty: 90 | Fill #0

## 2020-03-04 MED FILL — NITROGLYCERIN 0.4 MG TAB SL: 0.4 | 7 days supply | Qty: 25 | Fill #0

## 2020-03-04 NOTE — Discharge Summary (Signed)
Discharge Summary    Patient ID: [name],  MRN: [id number], DOB/AGE: [birth year] 64 y.o.  Admit date: 03/03/2020 Discharge date: 03/04/2020  Primary Care Provider: Marva Panda Primary Cardiologist: No primary care provider on file.  Discharge Diagnoses    Principal Problem:   NSTEMI (non-ST elevated myocardial infarction) (HCC) Active Problems:   HLD (hyperlipidemia)   DM (diabetes mellitus) type 2, uncontrolled, with ketoacidosis (HCC)   Allergies Allergies  Allergen Reactions  . Latex Itching  . Penicillins Swelling and Other (See Comments)    Itching  . Vicodin [Hydrocodone-Acetaminophen] Other (See Comments)    Hallucinations     Diagnostic Studies/Procedures   Left Heart Cath:   Mid Cx to Dist Cx lesion is 50% stenosed.  Prox LAD to Mid LAD lesion is 25% stenosed.  2nd Mrg lesion is 25% stenosed.  Post intervention, there is a 0% residual stenosis.  Prox RCA lesion is 50% stenosed.  Dist RCA lesion is 25% stenosed.  Prox RCA to Mid RCA lesion is 100% stenosed. This is the culprit vessel.  After large thrombus removal with aspiration thrombectomy, a drug-eluting stent was successfully placed using a SYNERGY XD 3.0X48, postdilated to 4 mm proximally. Stent optimized with IVUS.  The left ventricular ejection fraction is 45-50% by visual estimate. Inferior hypokinesis.  LV end diastolic pressure is mildly elevated.  There is mild left ventricular systolic dysfunction.  There is no aortic valve stenosis.  Severe tortuosity of right subclavian necessitating use of 75 Fr slender sheath.   Single vessel CAD.  Increase atorvastatin.  Start low dose beta blocker along with DAPT.  Holding ARB until BP stabilizes, and we check renal function post cath.  Check A1C.    Echocardiogram (03/04/20): 1. Left ventricular ejection fraction, by estimation, is 60 to 65%. The  left ventricle has normal function. The left ventricle has no regional  wall  motion abnormalities. Left ventricular diastolic parameters were  normal.  2. Right ventricular systolic function is normal. The right ventricular  size is normal. There is normal pulmonary artery systolic pressure.  3. Left atrial size was mildly dilated.  4. The mitral valve is normal in structure. Trivial mitral valve  regurgitation.  5. The aortic valve is normal in structure. Aortic valve regurgitation is  not visualized.  6. The inferior vena cava is normal in size with greater than 50%  respiratory variability, suggesting right atrial pressure of 3 mmHg.   FINDINGS  Left Ventricle: Left ventricular ejection fraction, by estimation, is 60  to 65%. The left ventricle has normal function. The left ventricle has no  regional wall motion abnormalities. The left ventricular internal cavity  size was normal in size. There is  no left ventricular hypertrophy. Left ventricular diastolic parameters  were normal.       _____________   History of Present Illness     Andrea Riley is a 64 year old female with a pertinent past medical history of hyperlipidemia, hypertension, tobacco use disorder, and a significant family history of CAD, who presented to the ED with a month-long history of intermittent right and left chest pressure with exertional dyspnea and fatigue that progressed over the last 2 days.  In the ED the patient was found to have ST elevations in lead I, 2 with T wave inversions in aVL with repeat EKG showing progressive anterior lead changes.  Furthermore she had elevations in troponins and an AKI.  Patient was urgently brought to the Cath Lab where she received  PCI with stenting to the RCA with improvement of her chest pain.  Subsequently, patient showed improvement of her EKG changes with T wave inversions in U waves in inferior leads.  She was started on dual antiplatelet therapy with aspirin and Brilinta, beta-blocker, low-dose ACE inhibitor and high intensity statin.   Follow-up echo showed a normal ejection fraction of 60-65% without regional wall motion abnormalities or valvular problems.  Furthermore patient was diagnosed with diabetes mellitus during this admission with an A1c of 6.5.  She was counseled on future risk reduction including smoking and diabetes management in the outpatient setting.  She was set up with cardiac PT and a follow-up appointment in the cardiology office once discharged.  Hospital Course     Consultants: Cardiac PT   General: Well developed, well nourished, female appearing in no acute distress. Head: Normocephalic, atraumatic.  Neck: Supple without bruits, JVD. Lungs:  Resp regular and unlabored, CTA. Heart: RRR, S1, S2, no S3, S4, or murmur; no rub. Abdomen: Soft, non-tender, non-distended with normoactive bowel sounds. No hepatomegaly. No rebound/guarding. No obvious abdominal masses. Extremities: No clubbing, cyanosis, edema. Distal pedal pulses are 2+ bilaterally.  Neuro: Alert and oriented X 3. Moves all extremities spontaneously. Psych: Normal affect.  Nila NephewSabrina L Southern was seen by Dr. Excell Seltzerooper and determined stable for discharge home. Follow up in the office has been arranged. Medications are listed below.   _____________  Discharge Vitals Blood pressure (!) 156/66, pulse 66, temperature 98.1 F (36.7 C), temperature source Oral, resp. rate 20, height 5\' 2"  (1.575 m), weight 67.3 kg, SpO2 98 %.  Filed Weights   03/03/20 0920 03/03/20 1627 03/04/20 0500  Weight: 69 kg 71.8 kg 67.3 kg    Labs & Radiologic Studies    CBC Recent Labs    03/03/20 0934 03/04/20 0036  WBC 14.5* 12.1*  HGB 11.9* 11.0*  HCT 36.7 33.1*  MCV 91.5 88.7  PLT 352 327   Basic Metabolic Panel Recent Labs    16/01/9610/04/21 0934 03/04/20 0036  NA 136 138  K 4.1 3.8  CL 99 105  CO2 24 23  GLUCOSE 195* 107*  BUN 17 12  CREATININE 1.33* 1.06*  CALCIUM 9.2 9.4   Liver Function Tests No results for input(s): AST, ALT, ALKPHOS, BILITOT,  PROT, ALBUMIN in the last 72 hours. No results for input(s): LIPASE, AMYLASE in the last 72 hours. Cardiac Enzymes No results for input(s): CKTOTAL, CKMB, CKMBINDEX, TROPONINI in the last 72 hours. BNP Invalid input(s): POCBNP D-Dimer No results for input(s): DDIMER in the last 72 hours. Hemoglobin A1C Recent Labs    03/04/20 0036  HGBA1C 6.5*   Fasting Lipid Panel Recent Labs    03/04/20 0036  CHOL 144  HDL 45  LDLCALC 80  TRIG 94  CHOLHDL 3.2   Thyroid Function Tests No results for input(s): TSH, T4TOTAL, T3FREE, THYROIDAB in the last 72 hours.  Invalid input(s): FREET3 _____________  CARDIAC CATHETERIZATION  Addendum Date: 03/03/2020    Mid Cx to Dist Cx lesion is 50% stenosed.  Prox LAD to Mid LAD lesion is 25% stenosed.  2nd Mrg lesion is 25% stenosed.  Post intervention, there is a 0% residual stenosis.  Prox RCA lesion is 50% stenosed.  Dist RCA lesion is 25% stenosed.  Prox RCA to Mid RCA lesion is 100% stenosed. This is the culprit vessel.  After large thrombus removal with aspiration thrombectomy, a drug-eluting stent was successfully placed using a SYNERGY XD 3.0X48, postdilated to 4 mm proximally.  Stent optimized with IVUS.  The left ventricular ejection fraction is 45-50% by visual estimate. Inferior hypokinesis.  LV end diastolic pressure is mildly elevated.  There is mild left ventricular systolic dysfunction.  There is no aortic valve stenosis.  Severe tortuosity of right subclavian necessitating use of 75 Fr slender sheath.  Single vessel CAD.  Increase atorvastatin.  Start low dose beta blocker along with DAPT.  Holding ARB until BP stabilizes, and we check renal function post cath.  Check A1C.  Results discussed with husband by phone.  Monna Crean [phone]   Result Date: 03/03/2020  Mid Cx to Dist Cx lesion is 50% stenosed.  Prox LAD to Mid LAD lesion is 25% stenosed.  2nd Mrg lesion is 25% stenosed.  Post intervention, there is a 0% residual  stenosis.  Prox RCA lesion is 50% stenosed.  Dist RCA lesion is 25% stenosed.  Prox RCA to Mid RCA lesion is 100% stenosed. This is the culprit vessel.  After large thrombus removal with aspiration thrombectomy, a drug-eluting stent was successfully placed using a SYNERGY XD 3.0X48, postdilated to 4 mm proximally. Stent optimized with IVUS.  The left ventricular ejection fraction is 45-50% by visual estimate. Inferior hypokinesis.  LV end diastolic pressure is mildly elevated.  There is mild left ventricular systolic dysfunction.  There is no aortic valve stenosis.  Single vessel CAD.  Increase atorvastatin.  Start low dose beta blocker along with DAPT.  Holding ARB until BP stabilizes, and we check renal function post cath.  Check A1C.  Results discussed with husband by phone.  Canyon Willow [phone]   DG Chest Portable 1 View  Result Date: 03/03/2020 CLINICAL DATA:  Chest pain EXAM: PORTABLE CHEST 1 VIEW COMPARISON:  None. FINDINGS: The heart size and mediastinal contours are within normal limits. Both lungs are clear. No pleural effusion or pneumothorax. The visualized skeletal structures are unremarkable. IMPRESSION: No acute process in the chest. Electronically Signed   By: Guadlupe Spanish M.D.   On: 03/03/2020 10:45   ECHOCARDIOGRAM COMPLETE  Result Date: 03/04/2020    ECHOCARDIOGRAM REPORT   Patient Name:   Andrea Riley Date of Exam: 03/04/2020 Medical Rec #:  [id number]      Height:       62.0 in Accession #:    [id number]     Weight:       148.4 lb Date of Birth:  29-Nov-1955      BSA:          1.684 m Patient Age:    64 years       BP:           117/82 mmHg Patient Gender: F              HR:           66 bpm. Exam Location:  Inpatient Procedure: 2D Echo Indications:    NSTEMI  History:        Patient has no prior history of Echocardiogram examinations.                 Risk Factors:Hypertension, Dyslipidemia, Current Smoker and                 Family History of Coronary Artery Disease.   Sonographer:    Delcie Roch Referring Phys: [phone] JILL D MCDANIEL IMPRESSIONS  1. Left ventricular ejection fraction, by estimation, is 60 to 65%. The left ventricle has normal function. The left ventricle has no regional  wall motion abnormalities. Left ventricular diastolic parameters were normal.  2. Right ventricular systolic function is normal. The right ventricular size is normal. There is normal pulmonary artery systolic pressure.  3. Left atrial size was mildly dilated.  4. The mitral valve is normal in structure. Trivial mitral valve regurgitation.  5. The aortic valve is normal in structure. Aortic valve regurgitation is not visualized.  6. The inferior vena cava is normal in size with greater than 50% respiratory variability, suggesting right atrial pressure of 3 mmHg. FINDINGS  Left Ventricle: Left ventricular ejection fraction, by estimation, is 60 to 65%. The left ventricle has normal function. The left ventricle has no regional wall motion abnormalities. The left ventricular internal cavity size was normal in size. There is  no left ventricular hypertrophy. Left ventricular diastolic parameters were normal. Right Ventricle: The right ventricular size is normal. Right vetricular wall thickness was not assessed. Right ventricular systolic function is normal. There is normal pulmonary artery systolic pressure. The tricuspid regurgitant velocity is 1.76 m/s, and with an assumed right atrial pressure of 3 mmHg, the estimated right ventricular systolic pressure is 15.4 mmHg. Left Atrium: Left atrial size was mildly dilated. Right Atrium: Right atrial size was normal in size. Pericardium: Trivial pericardial effusion is present. Mitral Valve: The mitral valve is normal in structure. Trivial mitral valve regurgitation. Tricuspid Valve: The tricuspid valve is normal in structure. Tricuspid valve regurgitation is trivial. Aortic Valve: The aortic valve is normal in structure. Aortic valve regurgitation is  not visualized. Pulmonic Valve: The pulmonic valve was grossly normal. Pulmonic valve regurgitation is not visualized. Aorta: The aortic root is normal in size and structure. Venous: The inferior vena cava is normal in size with greater than 50% respiratory variability, suggesting right atrial pressure of 3 mmHg. IAS/Shunts: No atrial level shunt detected by color flow Doppler.  LEFT VENTRICLE PLAX 2D LVIDd:         4.20 cm Diastology LVIDs:         2.70 cm LV e' medial:    6.64 cm/s LV PW:         1.10 cm LV E/e' medial:  12.8 LV IVS:        1.10 cm LV e' lateral:   10.20 cm/s                        LV E/e' lateral: 8.3  RIGHT VENTRICLE             IVC RV S prime:     11.00 cm/s  IVC diam: 1.90 cm TAPSE (M-mode): 1.7 cm LEFT ATRIUM             Index       RIGHT ATRIUM           Index LA diam:        3.70 cm 2.20 cm/m  RA Area:     12.20 cm LA Vol (A2C):   43.1 ml 25.60 ml/m RA Volume:   27.70 ml  16.45 ml/m LA Vol (A4C):   56.9 ml 33.79 ml/m LA Biplane Vol: 51.9 ml 30.82 ml/m  AORTIC VALVE LVOT Vmax:   112.00 cm/s LVOT Vmean:  66.100 cm/s LVOT VTI:    0.218 m  AORTA Ao Asc diam: 2.80 cm MITRAL VALVE               TRICUSPID VALVE MV Area (PHT): 3.72 cm    TR Peak grad:   12.4 mmHg  MV Decel Time: 204 msec    TR Vmax:        176.00 cm/s MV E velocity: 84.80 cm/s MV A velocity: 94.70 cm/s  SHUNTS MV E/A ratio:  0.90        Systemic VTI: 0.22 m Dietrich Pates MD Electronically signed by Dietrich Pates MD Signature Date/Time: 03/04/2020/11:39:18 AM    Final    Disposition   Pt is being discharged home today in good condition.  Follow-up Plans & Appointments     Follow-up Information    Rosalio Macadamia, NP Follow up on 03/21/2020.   Specialties: Nurse Practitioner, Interventional Cardiology, Cardiology, Radiology Why: 2:15 pm for hospital follow up Contact information: [address] [place] [address] [phone]              Discharge Instructions    Amb Referral to Cardiac  Rehabilitation   Complete by: As directed    Diagnosis:  Coronary Stents NSTEMI     After initial evaluation and assessments completed: Virtual Based Care may be provided alone or in conjunction with Phase 2 Cardiac Rehab based on patient barriers.: Yes   Diet - low sodium heart healthy   Complete by: As directed    Discharge instructions   Complete by: As directed    No driving for 2 days. No lifting over 5 lbs for 1 week. No sexual activity for 1 week. You may return to work in 1 week, if applicable. Keep procedure site clean & dry. If you notice increased pain, swelling, bleeding or pus, call/return!  You may shower, but no soaking baths/hot tubs/pools for 1 week.   Increase activity slowly   Complete by: As directed        Discharge Medications     Medication List    TAKE these medications   ALPRAZolam 1 MG tablet Commonly known as: XANAX Take 1 mg by mouth at bedtime as needed for sleep.   aspirin EC 81 MG tablet Take 1 tablet (81 mg total) by mouth daily. Swallow whole.   atorvastatin 80 MG tablet Commonly known as: LIPITOR Take 1 tablet (80 mg total) by mouth daily. Start taking on: March 05, 2020 What changed:   medication strength  how much to take   esomeprazole 20 MG capsule Commonly known as: NEXIUM Take 40 mg by mouth daily at 12 noon. Sometimes takes a extra dose around 3pm   fenofibrate 145 MG tablet Commonly known as: TRICOR Take 1 tablet by mouth daily.   metoprolol tartrate 25 MG tablet Commonly known as: LOPRESSOR Take 0.5 tablets (12.5 mg total) by mouth 2 (two) times daily.   nitroGLYCERIN 0.4 MG SL tablet Commonly known as: NITROSTAT Place 1 tablet (0.4 mg total) under the tongue every 5 (five) minutes x 3 doses as needed for chest pain.   sertraline 50 MG tablet Commonly known as: ZOLOFT Take 50 mg by mouth daily.   ticagrelor 90 MG Tabs tablet Commonly known as: BRILINTA Take 1 tablet (90 mg total) by mouth 2 (two) times daily.    valsartan-hydrochlorothiazide 80-12.5 MG tablet Commonly known as: Diovan HCT Take 0.5 tablets by mouth daily. Begin on 03/05/20. What changed:   how much to take  additional instructions          Yes                               AHA/ACC Clinical Performance &  Quality Measures: 1. Aspirin prescribed? - Yes 2. ADP Receptor Inhibitor (Plavix/Clopidogrel, Brilinta/Ticagrelor or Effient/Prasugrel) prescribed (includes medically managed patients)? - Yes 3. Beta Blocker prescribed? - Yes 4. High Intensity Statin (Lipitor 40-80mg  or Crestor 20-40mg ) prescribed? - Yes 5. EF assessed during THIS hospitalization? - Yes 6. For EF <40%, was ACEI/ARB prescribed? - Not Applicable (EF >/= 40%) 7. For EF <40%, Aldosterone Antagonist (Spironolactone or Eplerenone) prescribed? - Not Applicable (EF >/= 40%) 8. Cardiac Rehab Phase II ordered (including medically managed patients)? - Yes      Outstanding Labs/Studies   none  Duration of Discharge Encounter   Greater than 30 minutes including physician time.  Signed, Dellia Cloud, D.O. [facility], PGY-2 Pager: [phone], Phone: [phone] Date 03/04/2020 Time 4:22 PM

## 2020-03-04 NOTE — Progress Notes (Signed)
CARDIAC REHAB PHASE I   PRE:  Rate/Rhythm: 62 SR  BP:  Sitting: 125/66     SaO2: 98 RA  MODE:  Ambulation: 270 ft   POST:  Rate/Rhythm: 91 SR  BP:  Sitting: 145//57    SaO2: 98 RA   Pt ambulated 223ft in hallway indepently with steady gait. Pt with some visible SOB, but resolved quickly with rest. Pt denies CP or dizziness. Pt educated on importance of ASA, Brilinta, statin, and NTG. Pt given MI book and stent card along with heart healthy and diabetic diets. Pt states she has quit smoking in the past and is interested in doing so again, tip sheet given. Reviewed site care, restrictions, and exercise guidelines. Will refer to CRP II [place].  [phone] Reynold Bowen, RN BSN 03/04/2020 9:50 AM

## 2020-03-04 NOTE — Progress Notes (Addendum)
Progress Note  Patient Name: Andrea Riley Date of Encounter: 03/04/2020  Primary Cardiologist: No primary care provider on file.   Subjective   Patient resting in bed this morning. She states that she tolerated the cath well and her pain is substantially improved since admission.   Inpatient Medications    Scheduled Meds: . aspirin  324 mg Oral NOW   Or  . aspirin  300 mg Rectal NOW  . aspirin  81 mg Oral Daily  . atorvastatin  80 mg Oral Daily  . Chlorhexidine Gluconate Cloth  6 each Topical Daily  . insulin aspart  0-15 Units Subcutaneous TID WC  . metoprolol tartrate  12.5 mg Oral BID  . sodium chloride flush  3 mL Intravenous Q12H  . sodium chloride flush  3 mL Intravenous Q12H  . ticagrelor  90 mg Oral BID   Continuous Infusions: . sodium chloride    . sodium chloride    . sodium chloride     PRN Meds: sodium chloride, sodium chloride, acetaminophen, lip balm, nitroGLYCERIN, ondansetron (ZOFRAN) IV, sodium chloride flush, sodium chloride flush   Vital Signs    Vitals:   03/04/20 0700 03/04/20 0746 03/04/20 0750 03/04/20 0800  BP:   (!) 135/59 136/63  Pulse: 64   63  Resp: (!) 24   20  Temp:  98.2 F (36.8 C)    TempSrc:  Oral    SpO2: 97%   98%  Weight:      Height:        Intake/Output Summary (Last 24 hours) at 03/04/2020 0843 Last data filed at 03/03/2020 1600 Gross per 24 hour  Intake 220 ml  Output --  Net 220 ml   Filed Weights   03/03/20 0920 03/03/20 1627 03/04/20 0500  Weight: 69 kg 71.8 kg 67.3 kg    Telemetry    Sinus - Personally Reviewed  ECG    Inferior T-wave inversions and U-waves - Personally Reviewed  Physical Exam   GEN: No acute distress.   Neck: No JVD Cardiac: RRR, no murmurs, rubs, or gallops.  Respiratory: Clear to auscultation bilaterally. GI: Soft, nontender, non-distended  MS: No edema; No deformity. Neuro:  Nonfocal  Psych: Normal affect   Labs    Chemistry Recent Labs  Lab 03/03/20 0934  03/04/20 0036  NA 136 138  K 4.1 3.8  CL 99 105  CO2 24 23  GLUCOSE 195* 107*  BUN 17 12  CREATININE 1.33* 1.06*  CALCIUM 9.2 9.4  GFRNONAA 44* 58*  ANIONGAP 13 10     Hematology Recent Labs  Lab 03/03/20 0934 03/04/20 0036  WBC 14.5* 12.1*  RBC 4.01 3.73*  HGB 11.9* 11.0*  HCT 36.7 33.1*  MCV 91.5 88.7  MCH 29.7 29.5  MCHC 32.4 33.2  RDW 14.6 14.7  PLT 352 327    Cardiac EnzymesNo results for input(s): TROPONINI in the last 168 hours. No results for input(s): TROPIPOC in the last 168 hours.   BNPNo results for input(s): BNP, PROBNP in the last 168 hours.   DDimer No results for input(s): DDIMER in the last 168 hours.   Radiology    CARDIAC CATHETERIZATION  Addendum Date: 03/03/2020    Mid Cx to Dist Cx lesion is 50% stenosed.  Prox LAD to Mid LAD lesion is 25% stenosed.  2nd Mrg lesion is 25% stenosed.  Post intervention, there is a 0% residual stenosis.  Prox RCA lesion is 50% stenosed.  Dist RCA lesion is 25%  stenosed.  Prox RCA to Mid RCA lesion is 100% stenosed. This is the culprit vessel.  After large thrombus removal with aspiration thrombectomy, a drug-eluting stent was successfully placed using a SYNERGY XD 3.0X48, postdilated to 4 mm proximally. Stent optimized with IVUS.  The left ventricular ejection fraction is 45-50% by visual estimate. Inferior hypokinesis.  LV end diastolic pressure is mildly elevated.  There is mild left ventricular systolic dysfunction.  There is no aortic valve stenosis.  Severe tortuosity of right subclavian necessitating use of 75 Fr slender sheath.  Single vessel CAD.  Increase atorvastatin.  Start low dose beta blocker along with DAPT.  Holding ARB until BP stabilizes, and we check renal function post cath.  Check A1C.  Results discussed with husband by phone.  Andrea Riley [phone]   Result Date: 03/03/2020  Mid Cx to Dist Cx lesion is 50% stenosed.  Prox LAD to Mid LAD lesion is 25% stenosed.  2nd Mrg lesion is 25%  stenosed.  Post intervention, there is a 0% residual stenosis.  Prox RCA lesion is 50% stenosed.  Dist RCA lesion is 25% stenosed.  Prox RCA to Mid RCA lesion is 100% stenosed. This is the culprit vessel.  After large thrombus removal with aspiration thrombectomy, a drug-eluting stent was successfully placed using a SYNERGY XD 3.0X48, postdilated to 4 mm proximally. Stent optimized with IVUS.  The left ventricular ejection fraction is 45-50% by visual estimate. Inferior hypokinesis.  LV end diastolic pressure is mildly elevated.  There is mild left ventricular systolic dysfunction.  There is no aortic valve stenosis.  Single vessel CAD.  Increase atorvastatin.  Start low dose beta blocker along with DAPT.  Holding ARB until BP stabilizes, and we check renal function post cath.  Check A1C.  Results discussed with husband by phone.  Andrea Riley [phone]   DG Chest Portable 1 View  Result Date: 03/03/2020 CLINICAL DATA:  Chest pain EXAM: PORTABLE CHEST 1 VIEW COMPARISON:  None. FINDINGS: The heart size and mediastinal contours are within normal limits. Both lungs are clear. No pleural effusion or pneumothorax. The visualized skeletal structures are unremarkable. IMPRESSION: No acute process in the chest. Electronically Signed   By: Guadlupe Spanish M.D.   On: 03/03/2020 10:45    Cardiac Studies   See above.  Patient Profile     Andrea Riley is a 64 y.o. female with a history of hypertension, hyperlipidemia, ongoing tobacco use and a family history of CAD who presented to [facility] with a month-long history of intermittent right and left breast pressure with exertional dyspnea and fatigue and admitted for STEMI  Assessment & Plan     1. NSTEMI: Patient is s/p PCI with stent in the RCA with improvement of her chest pain. EKG shows good improvement with T-wave inversions and U-waves in the inferior leads. Patient was started on high-intensity statin, beta blocker, Dual antiplatelet therapy (ASA  and Brilinta). I will add low does ACEI today.  - Continue Metoprolol 12.5 mg BID, ASA 81 mg, Brilinta 90 mg, atorvastatin 80 mg - Start lisinopril 5 mg daily - Will get TOC consult for medication assistance.  - Echo pending - will consider discharging patient today if echo results are favorable.   2. HTN: - Blood pressure is hypertensive today - 136/63>140/63 - AKI has resolved and electrolytes are stable.   3. HLD: - Continue atorvastatin 80 mg   4. Tobacco Use: - Counseled patient regarding smoking cessation.   5. Diabetes Mellitus: -  Hgb A1c elevated at 6.5 today.  - Discussed results with patient. Counseled regarding possible out patient medication initiation and life style modifications.  - Will do CBG monitoring and SSI while hospitalized.   For questions or updates, please contact [facility] Please consult [email or web address] for contact info under Cardiology/STEMI.   Signed, Dellia Cloud, MD  03/04/2020, 8:43 AM    Patient seen, examined. Available data reviewed. Agree with findings, assessment, and plan as outlined by Orinda Kenner, MD. the patient is independently interviewed and examined.  Lungs are clear, heart is regular rate and rhythm with no murmur gallop, right radial cath site is clear, abdomen is soft and nontender, extremities have no edema, skin is warm and dry without rash.  I personally reviewed her echo images which show vigorous LV function with no regional wall motion abnormalities identified.  Her cardiac catheterization images are also reviewed and I agree that the residual coronary disease primarily in the distal circumflex should be managed medically.  She had an excellent result with PCI of the right coronary artery which was her culprit vessel responsible for her myocardial infarction.  I have reviewed her medications and agree with the medicines outlined above.  We will start her back on half the previous dose of her valsartan/HCTZ.  Otherwise she will be  treated with dual antiplatelet therapy using aspirin and ticagrelor, high intensity statin drug, and a beta-blocker.  She will follow up with her primary care physician for consideration of oral hypoglycemic therapy for type 2 diabetes.  Her hemoglobin A1c is 6.5.  Tonny Bollman, M.D. 03/04/2020 11:11 AM

## 2020-03-04 NOTE — Progress Notes (Signed)
  Echocardiogram 2D Echocardiogram has been performed.  Delcie Roch 03/04/2020, 8:38 AM

## 2020-03-07 NOTE — Progress Notes (Signed)
CARDIOLOGY OFFICE NOTE  Date:  03/21/2020    Andrea Riley Date of Birth: 28-Feb-1956 Medical Record #[id number]  PCP:  Marva Panda, NP  Cardiologist:  Eldridge Dace ([place])  Chief Complaint  Patient presents with  . Hospitalization Follow-up    History of Present Illness: Andrea Riley is a 64 y.o. female who presents today for a TOC visit - however this is outside the time frame - she is seen for Dr. Eldridge Dace ([place]).   She has a history of HLD, HTN, tobacco use disorder, and a significant family history of CAD.   She recently came to the ED with a month long history of chest pain/DOE and fatigue. EKG with ST elevation. Urgent cath with PCI/DES of the RCA. DAPT was started along with beta blocker, ACE and statin. EF well preserved. Was found to have diabetes as well.   Comes in today. Here alone. Doing ok. Anxious to go back to work. Works for [employer]. She feels fine. Some mild shortness of breath. She is on DAPT with Brilinta. No bleeding. No problems with her medicines. She has basically resumed all her activities. She is not sure about cardiac rehab. She is pretty active with work. Has not taken her Diovan HCT today. She is aware that she needs to eat better.   Past Medical History:  Diagnosis Date  . Anxiety   . Diabetes (HCC)   . Hyperlipemia   . Hypertension   . Insomnia   . Thyroid disease    Hypothyroidism    Past Surgical History:  Procedure Laterality Date  . ABDOMINAL HYSTERECTOMY  2003   total  . CARPAL TUNNEL RELEASE Bilateral 08/2011 & 04/2012  . CATARACT EXTRACTION Bilateral    and lens replacement  . CESAREAN SECTION    . CORONARY/GRAFT ACUTE MI REVASCULARIZATION N/A 03/03/2020   Procedure: Coronary/Graft Acute MI Revascularization;  Surgeon: Corky Crafts, MD;  Location: [facility] INVASIVE CV LAB;  Service: Cardiovascular;  Laterality: N/A;  . INTRAVASCULAR ULTRASOUND/IVUS N/A 03/03/2020   Procedure: Intravascular Ultrasound/IVUS;  Surgeon:  Corky Crafts, MD;  Location: [facility] INVASIVE CV LAB;  Service: Cardiovascular;  Laterality: N/A;  . LEFT HEART CATH AND CORONARY ANGIOGRAPHY N/A 03/03/2020   Procedure: LEFT HEART CATH AND CORONARY ANGIOGRAPHY;  Surgeon: Corky Crafts, MD;  Location: [facility] INVASIVE CV LAB;  Service: Cardiovascular;  Laterality: N/A;  . TOTAL HIP ARTHROPLASTY Right 1996     Medications: Current Meds  Medication Sig  . ALPRAZolam (XANAX) 1 MG tablet Take 1 mg by mouth at bedtime as needed for sleep.  Marland Kitchen aspirin EC 81 MG tablet Take 1 tablet (81 mg total) by mouth daily. Swallow whole.  Marland Kitchen atorvastatin (LIPITOR) 80 MG tablet Take 1 tablet (80 mg total) by mouth daily.  Marland Kitchen esomeprazole (NEXIUM) 20 MG capsule Take 40 mg by mouth daily at 12 noon. Sometimes takes a extra dose around 3pm  . fenofibrate (TRICOR) 145 MG tablet Take 1 tablet by mouth daily.  . metoprolol tartrate (LOPRESSOR) 25 MG tablet Take 0.5 tablets (12.5 mg total) by mouth 2 (two) times daily.  . nitroGLYCERIN (NITROSTAT) 0.4 MG SL tablet Place 1 tablet (0.4 mg total) under the tongue every 5 (five) minutes x 3 doses as needed for chest pain.  Marland Kitchen sertraline (ZOLOFT) 50 MG tablet Take 50 mg by mouth daily.  . ticagrelor (BRILINTA) 90 MG TABS tablet Take 1 tablet (90 mg total) by mouth 2 (two) times daily.  . valsartan-hydrochlorothiazide (DIOVAN  HCT) 80-12.5 MG tablet Take 0.5 tablets by mouth daily. Begin on 03/05/20.     Allergies: Allergies  Allergen Reactions  . Latex Itching  . Penicillins Swelling and Other (See Comments)    Itching  . Vicodin [Hydrocodone-Acetaminophen] Other (See Comments)    Hallucinations     Social History: The patient  reports that she has been smoking cigarettes. She has a 6.00 pack-year smoking history. She has never used smokeless tobacco. She reports that she does not drink alcohol and does not use drugs.   Family History: The patient's family history includes Diabetes in her mother; Heart disease in  her mother; Hypertension in her father and mother; Stomach cancer in her maternal grandmother and mother; Thyroid disease in her mother.   Review of Systems: Please see the history of present illness.   All other systems are reviewed and negative.   Physical Exam: VS:  BP 140/62   Pulse 74   Ht 5\' 2"  (1.575 m)   Wt 148 lb (67.1 kg)   SpO2 96%   BMI 27.07 kg/m  .  BMI Body mass index is 27.07 kg/m.  Wt Readings from Last 3 Encounters:  03/21/20 148 lb (67.1 kg)  03/04/20 148 lb 5.9 oz (67.3 kg)  12/30/19 153 lb (69.4 kg)    General: Pleasant. Alert and in no acute distress.   Cardiac: Regular rate and rhythm. No murmurs, rubs, or gallops. No edema.  Respiratory:  Lungs are clear to auscultation bilaterally with normal work of breathing.  GI: Soft and nontender.  MS: No deformity or atrophy. Gait and ROM intact.  Skin: Warm and dry. Color is normal.  Neuro:  Strength and sensation are intact and no gross focal deficits noted.  Psych: Alert, appropriate and with normal affect. Right wrist is fine.    LABORATORY DATA:  EKG:  EKG is not ordered today.    Lab Results  Component Value Date   WBC 12.1 (H) 03/04/2020   HGB 11.0 (L) 03/04/2020   HCT 33.1 (L) 03/04/2020   PLT 327 03/04/2020   GLUCOSE 107 (H) 03/04/2020   CHOL 144 03/04/2020   TRIG 94 03/04/2020   HDL 45 03/04/2020   LDLCALC 80 03/04/2020   NA 138 03/04/2020   K 3.8 03/04/2020   CL 105 03/04/2020   CREATININE 1.06 (H) 03/04/2020   BUN 12 03/04/2020   CO2 23 03/04/2020   HGBA1C 6.5 (H) 03/04/2020     BNP (last 3 results) No results for input(s): BNP in the last 8760 hours.  ProBNP (last 3 results) No results for input(s): PROBNP in the last 8760 hours.   Other Studies Reviewed Today:  Left Heart Cath:   Mid Cx to Dist Cx lesion is 50% stenosed.  Prox LAD to Mid LAD lesion is 25% stenosed.  2nd Mrg lesion is 25% stenosed.  Post intervention, there is a 0% residual stenosis.  Prox RCA  lesion is 50% stenosed.  Dist RCA lesion is 25% stenosed.  Prox RCA to Mid RCA lesion is 100% stenosed. This is the culprit vessel.  After large thrombus removal with aspiration thrombectomy, a drug-eluting stent was successfully placed using a SYNERGY XD 3.0X48, postdilated to 4 mm proximally. Stent optimized with IVUS.  The left ventricular ejection fraction is 45-50% by visual estimate. Inferior hypokinesis.  LV end diastolic pressure is mildly elevated.  There is mild left ventricular systolic dysfunction.  There is no aortic valve stenosis.  Severe tortuosity of right subclavian necessitating use  of 75 Fr slender sheath.  Single vessel CAD. Increase atorvastatin. Start low dose beta blocker along with DAPT. Holding ARB until BP stabilizes, and we check renal function post cath. Check A1C.   Echocardiogram (03/04/20): 1. Left ventricular ejection fraction, by estimation, is 60 to 65%. The  left ventricle has normal function. The left ventricle has no regional  wall motion abnormalities. Left ventricular diastolic parameters were  normal.  2. Right ventricular systolic function is normal. The right ventricular  size is normal. There is normal pulmonary artery systolic pressure.  3. Left atrial size was mildly dilated.  4. The mitral valve is normal in structure. Trivial mitral valve  regurgitation.  5. The aortic valve is normal in structure. Aortic valve regurgitation is  not visualized.  6. The inferior vena cava is normal in size with greater than 50%  respiratory variability, suggesting right atrial pressure of 3 mmHg.   FINDINGS  Left Ventricle: Left ventricular ejection fraction, by estimation, is 60  to 65%. The left ventricle has normal function. The left ventricle has no  regional wall motion abnormalities. The left ventricular internal cavity  size was normal in size. There is  no left ventricular hypertrophy. Left ventricular diastolic parameters   were normal.       Assessment & Plan    1. NSTEMI with PCI/DES to the RCA - she is on DAPT - some mild shortness of breath - I suspect this is from the Brilinta - she is going to try taking with caffeine. No changes made today. She had well preserved LV function - she is on ARB therapy. Does have some mild residual disease which will be managed medically. She is not sure about cardiac rehab - will let us know. Ok to return to work - note is given.   2. HLD - on high dose statin - needs labs on return.   3. HTN - has not had her Diovan HCT yet today - will follow for now.   4. New diabetes - she was not really aware of this but seems motivated to make changes with her diet.   Current medicines are reviewed with the patient today.  The patient does not have concerns regarding medicines other than what has been noted above.  The following changes have been made:  See above.  Labs/ tests ordered today include:    Orders Placed This Encounter  Procedures  . Basic metabolic panel  . CBC     Disposition:   FU with Dr. Eldridge Dace in 4 to 6 weeks with fasting labs.    Patient is agreeable to this plan and will call if any problems develop in the interim.   SignedNorma Fredrickson, NP  03/21/2020 2:26 PM  [facility] [address] [place], [place]  [address] Phone: [phone] Fax: [phone]

## 2020-03-08 ENCOUNTER — Telehealth (HOSPITAL_COMMUNITY): Payer: Self-pay

## 2020-03-08 NOTE — Telephone Encounter (Signed)
Pt insurance is active and benefits verified through Medicare A/B. Co-pay $0.00, DED $203.00/$203.00 met, out of pocket $0.00/$0.00 met, co-insurance 20%. No pre-authorization required. Passport, 03/08/20 @ 3:50PM, UVJ#[id number]  Will contact patient to see if she is interested in the Cardiac Rehab Program. If interested, patient will need to complete follow up appt. Once completed, patient will be contacted for scheduling upon review by the RN Navigator.

## 2020-03-08 NOTE — Telephone Encounter (Signed)
Attempted to call patient in regards to Cardiac Rehab - unable to leave VM. VM box not set up.

## 2020-03-14 ENCOUNTER — Telehealth (HOSPITAL_COMMUNITY): Payer: Self-pay | Admitting: Pharmacist

## 2020-03-21 ENCOUNTER — Ambulatory Visit (INDEPENDENT_AMBULATORY_CARE_PROVIDER_SITE_OTHER): Payer: Medicare Other | Admitting: Nurse Practitioner

## 2020-03-21 ENCOUNTER — Encounter: Payer: Self-pay | Admitting: Nurse Practitioner

## 2020-03-21 ENCOUNTER — Other Ambulatory Visit: Payer: Self-pay

## 2020-03-21 VITALS — BP 140/62 | HR 74 | Ht 62.0 in | Wt 148.0 lb

## 2020-03-21 DIAGNOSIS — I214 Non-ST elevation (NSTEMI) myocardial infarction: Secondary | ICD-10-CM | POA: Diagnosis not present

## 2020-03-21 DIAGNOSIS — I10 Essential (primary) hypertension: Secondary | ICD-10-CM | POA: Diagnosis not present

## 2020-03-21 DIAGNOSIS — Z955 Presence of coronary angioplasty implant and graft: Secondary | ICD-10-CM

## 2020-03-21 DIAGNOSIS — E782 Mixed hyperlipidemia: Secondary | ICD-10-CM

## 2020-03-21 NOTE — Patient Instructions (Addendum)
After Visit Summary:  We will be checking the following labs today - BMET and CBC   Medication Instructions:    Continue with your current medicines.    If you need a refill on your cardiac medications before your next appointment, please call your pharmacy.     Testing/Procedures To Be Arranged:  N/A  Follow-Up:   See Dr. Eldridge Dace in 6 weeks with fasting labs    At [facility], you and your health needs are our priority.  As part of our continuing mission to provide you with exceptional heart care, we have created designated Provider Care Teams.  These Care Teams include your primary Cardiologist (physician) and Advanced Practice Providers (APPs -  Physician Assistants and Nurse Practitioners) who all work together to provide you with the care you need, when you need it.  Special Instructions:  . Stay safe, wash your hands for at least 20 seconds and wear a mask when needed.  . It was good to talk with you today.  . I think you are doing well. . Let us know if you are interested in cardiac rehab . I will give you a note to return to work for tomorrow   Call the [facility] office at [phone] if you have any questions, problems or concerns.

## 2020-03-22 LAB — CBC
Hematocrit: 35.8 % (ref 34.0–46.6)
Hemoglobin: 12.3 g/dL (ref 11.1–15.9)
MCH: 30 pg (ref 26.6–33.0)
MCHC: 34.4 g/dL (ref 31.5–35.7)
MCV: 87 fL (ref 79–97)
Platelets: 389 10*3/uL (ref 150–450)
RBC: 4.1 x10E6/uL (ref 3.77–5.28)
RDW: 12.6 % (ref 11.7–15.4)
WBC: 9.1 10*3/uL (ref 3.4–10.8)

## 2020-03-22 LAB — BASIC METABOLIC PANEL
BUN/Creatinine Ratio: 16 (ref 12–28)
BUN: 19 mg/dL (ref 8–27)
CO2: 28 mmol/L (ref 20–29)
Calcium: 9.8 mg/dL (ref 8.7–10.3)
Chloride: 101 mmol/L (ref 96–106)
Creatinine, Ser: 1.17 mg/dL — ABNORMAL HIGH (ref 0.57–1.00)
GFR calc Af Amer: 56 mL/min/{1.73_m2} — ABNORMAL LOW (ref >59)
GFR calc non Af Amer: 49 mL/min/{1.73_m2} — ABNORMAL LOW (ref >59)
Glucose: 141 mg/dL — ABNORMAL HIGH (ref 65–99)
Potassium: 3.9 mmol/L (ref 3.5–5.2)
Sodium: 140 mmol/L (ref 134–144)

## 2020-03-30 ENCOUNTER — Telehealth (HOSPITAL_COMMUNITY): Payer: Self-pay

## 2020-03-30 NOTE — Telephone Encounter (Signed)
Called and spoke with pt in regards to CR, pt stated she is not interested at this time.   Closed referral 

## 2020-04-14 ENCOUNTER — Telehealth: Payer: Self-pay | Admitting: Nurse Practitioner

## 2020-04-14 NOTE — Telephone Encounter (Signed)
Noted - I gave her a note at her OV with me to return to work.   Andrea Riley

## 2020-04-14 NOTE — Telephone Encounter (Signed)
Pts place of employment called to follow-up on papers they faxed to [phone], in regards to Norma Fredrickson NP filling these out so pt can return to work. Per Georgianne Fick with [employer], she faxed these papers on Tuesday, and has not received them back.  Georgianne Fick states that Lawson Fiscal did write a return to work letter back in November, but faxed paperwork will need to be filled out, to support this.   Georgianne Fick states this was faxed to [phone]. Georgianne Fick states there was 2 pages faxed to this machine.   Looked in [employer] as well as 0755 to see if forms received, and there was nothing. Lawson Fiscal will be working next Monday 12/20, so if forms can be obtained by then, she can fill them accordingly out at that time.  Will route this message to our Medical Records dept to see if they have any forms on this pt from her employer, for Lawson Fiscal to advise and sign off on.  Medical Records to obtain these forms and get them to [facility] to sign.   Will include Lawson Fiscal in on this message, to keep her updated.

## 2020-04-14 NOTE — Telephone Encounter (Signed)
Kea from [employer] calling to see if the fax for return to work was received. She states it is 2 pages.

## 2020-04-14 NOTE — Telephone Encounter (Signed)
Medical Records did received paperwork on Andrea Riley-03/14/20. Patient was called on 03/14/20 and informed that she would need to come to the office and complete a release form and make a payment of $29. Patient does not want to pay for it.Paperwork was placed in the Medical Records red folder for the patient to pick up 04/14/20 Andrea Riley called Andrea Riley to update her on  what is the hold up.Patient was informed again that we would need a payment and for her to sign a release. Patient stated she does not  need the paperwork anymore. 04/14/20 jc

## 2020-05-03 NOTE — Progress Notes (Unsigned)
Cardiology Office Note   Date:  05/04/2020   ID:  Andrea, Riley 03/31/1956, MRN [id number]  PCP:  Everardo Beals, NP    No chief complaint on file.  CAD  Wt Readings from Last 3 Encounters:  05/04/20 146 lb (66.2 kg)  03/21/20 148 lb (67.1 kg)  03/04/20 148 lb 5.9 oz (67.3 kg)       History of Present Illness: Andrea Riley is a 65 y.o. female  With h/o CAD.   She had an inferior MI in 11/21:  "Mid Cx to Dist Cx lesion is 50% stenosed.  Prox LAD to Mid LAD lesion is 25% stenosed.  2nd Mrg lesion is 25% stenosed.  Post intervention, there is a 0% residual stenosis.  Prox RCA lesion is 50% stenosed.  Dist RCA lesion is 25% stenosed.  Prox RCA to Mid RCA lesion is 100% stenosed. This is the culprit vessel.  After large thrombus removal with aspiration thrombectomy, a drug-eluting stent was successfully placed using a SYNERGY XD [id number], postdilated to 4 mm proximally. Stent optimized with IVUS.  The left ventricular ejection fraction is 45-50% by visual estimate. Inferior hypokinesis.  LV end diastolic pressure is mildly elevated.  There is mild left ventricular systolic dysfunction.  There is no aortic valve stenosis.  Severe tortuosity of right subclavian necessitating use of 75 Fr slender sheath.   Single vessel CAD.  Increase atorvastatin.  Start low dose beta blocker along with DAPT.  Holding ARB until BP stabilizes, and we check renal function post cath.  Check A1C. "   Since the last visit, she has not been doing dedicated exercise.  Her husband goes to the [facility].  She does not want to go to the gym due to [place].  She tolerated her COVID shots well.    No bleeding problems.    Denies : Chest pain. Dizziness. Leg edema. Nitroglycerin use. Orthopnea. Palpitations. Paroxysmal nocturnal dyspnea. Shortness of breath. Syncope.       Past Medical History:  Diagnosis Date  . Anxiety   . Diabetes ([place])   . Hyperlipemia   . Hypertension   .  Insomnia   . Thyroid disease    Hypothyroidism    Past Surgical History:  Procedure Laterality Date  . ABDOMINAL HYSTERECTOMY  2003   total  . CARPAL TUNNEL RELEASE Bilateral 08/2011 & 04/2012  . CATARACT EXTRACTION Bilateral    and lens replacement  . CESAREAN SECTION    . CORONARY/GRAFT ACUTE MI REVASCULARIZATION N/A 03/03/2020   Procedure: Coronary/Graft Acute MI Revascularization;  Surgeon: Jettie Booze, MD;  Location: [facility];  Service: Cardiovascular;  Laterality: N/A;  . INTRAVASCULAR ULTRASOUND/IVUS N/A 03/03/2020   Procedure: Intravascular Ultrasound/IVUS;  Surgeon: Jettie Booze, MD;  Location: [facility];  Service: Cardiovascular;  Laterality: N/A;  . LEFT HEART CATH AND CORONARY ANGIOGRAPHY N/A 03/03/2020   Procedure: LEFT HEART CATH AND CORONARY ANGIOGRAPHY;  Surgeon: Jettie Booze, MD;  Location: [facility];  Service: Cardiovascular;  Laterality: N/A;  . TOTAL HIP ARTHROPLASTY Right 1996     Current Outpatient Medications  Medication Sig Dispense Refill  . ALPRAZolam (XANAX) 1 MG tablet Take 1 mg by mouth at bedtime as needed for sleep.    Marland Kitchen aspirin EC 81 MG tablet Take 1 tablet (81 mg total) by mouth daily. Swallow whole. 150 tablet 2  . atorvastatin (LIPITOR) 80 MG tablet Take 1 tablet (80 mg total) by mouth daily.  90 tablet 3  . esomeprazole (NEXIUM) 20 MG capsule Take 40 mg by mouth daily at 12 noon. Sometimes takes a extra dose around 3pm    . fenofibrate (TRICOR) 145 MG tablet Take 1 tablet by mouth daily.    . metoprolol tartrate (LOPRESSOR) 25 MG tablet Take 0.5 tablets (12.5 mg total) by mouth 2 (two) times daily. 120 tablet 3  . nitroGLYCERIN (NITROSTAT) 0.4 MG SL tablet Place 1 tablet (0.4 mg total) under the tongue every 5 (five) minutes x 3 doses as needed for chest pain. 25 tablet 3  . sertraline (ZOLOFT) 50 MG tablet Take 50 mg by mouth daily.    . ticagrelor (BRILINTA) 90 MG TABS tablet Take 1 tablet (90 mg  total) by mouth 2 (two) times daily. 180 tablet 3  . valsartan-hydrochlorothiazide (DIOVAN HCT) 80-12.5 MG tablet Take 0.5 tablets by mouth daily. Begin on 03/05/20.     No current facility-administered medications for this visit.    Allergies:   Latex, Penicillins, and Vicodin [hydrocodone-acetaminophen]    Social History:  The patient  reports that she has been smoking cigarettes. She has a 6.00 pack-year smoking history. She has never used smokeless tobacco. She reports that she does not drink alcohol and does not use drugs.   Family History:  The patient's family history includes Diabetes in her mother; Heart disease in her mother; Hypertension in her father and mother; Stomach cancer in her maternal grandmother and mother; Thyroid disease in her mother.    ROS:  Please see the history of present illness.   Otherwise, review of systems are positive for less activity.   All other systems are reviewed and negative.    PHYSICAL EXAM: VS:  BP 120/60   Pulse (!) 58   Ht 5' 2.5" (1.588 m)   Wt 146 lb (66.2 kg)   SpO2 98%   BMI 26.28 kg/m  , BMI Body mass index is 26.28 kg/m. GEN: Well nourished, well developed, in no acute distress  HEENT: normal  Neck: no JVD, carotid bruits, or masses Cardiac: RRR; no murmurs, rubs, or gallops,no edema  Respiratory:  clear to auscultation bilaterally, normal work of breathing GI: soft, nontender, nondistended, + BS MS: no deformity or atrophy  Skin: warm and dry, no rash Neuro:  Strength and sensation are intact Psych: euthymic mood, full affect    Recent Labs: 03/21/2020: BUN 19; Creatinine, Ser 1.17; Hemoglobin 12.3; Platelets 389; Potassium 3.9; Sodium 140   Lipid Panel    Component Value Date/Time   CHOL 144 03/04/2020 0036   TRIG 94 03/04/2020 0036   HDL 45 03/04/2020 0036   CHOLHDL 3.2 03/04/2020 0036   VLDL 19 03/04/2020 0036   [place] 80 03/04/2020 0036     Other studies Reviewed: Additional studies/ records that were  reviewed today with results demonstrating: LDL 80 at the time of her MI.   ASSESSMENT AND PLAN:  1.   CAD/Old MI: s/p RCA stent.  Moderate left sided disease managed medically. No angina.  Continue aggressive secondary prevention.  2.   Hyperlipidemia: Whole food plant based diet. High dose statin.   3.   HTN: Low salt diet.  The current medical regimen is effective;  continue present plan and medications.  Not checking her BP at home.    She does need a wisdom tooth extraction.  She needs to stay on her Brilinta for now.  I think it is too soon after her MI to come off of it.  If her wisdom tooth extraction become some type of emergency, we will have to reconsider.  For now, her symptoms are stable.   Current medicines are reviewed at length with the patient today.  The patient concerns regarding her medicines were addressed.  The following changes have been made:  No change  Labs/ tests ordered today include: C met, lipids today No orders of the defined types were placed in this encounter.   Recommend 150 minutes/week of aerobic exercise Low fat, low carb, high fiber diet recommended  Disposition:   FU in 6 months   Signed, Larae Grooms, MD  05/04/2020 9:47 AM    [facility] [address], [place], [place]  [address] Phone: [phone]; Fax: [phone]

## 2020-05-04 ENCOUNTER — Ambulatory Visit (INDEPENDENT_AMBULATORY_CARE_PROVIDER_SITE_OTHER): Payer: Medicare Other | Admitting: Interventional Cardiology

## 2020-05-04 ENCOUNTER — Encounter: Payer: Self-pay | Admitting: Interventional Cardiology

## 2020-05-04 ENCOUNTER — Other Ambulatory Visit: Payer: Self-pay

## 2020-05-04 VITALS — BP 120/60 | HR 58 | Ht 62.5 in | Wt 146.0 lb

## 2020-05-04 DIAGNOSIS — I252 Old myocardial infarction: Secondary | ICD-10-CM | POA: Diagnosis not present

## 2020-05-04 DIAGNOSIS — Z955 Presence of coronary angioplasty implant and graft: Secondary | ICD-10-CM

## 2020-05-04 DIAGNOSIS — I10 Essential (primary) hypertension: Secondary | ICD-10-CM

## 2020-05-04 DIAGNOSIS — E782 Mixed hyperlipidemia: Secondary | ICD-10-CM

## 2020-05-04 DIAGNOSIS — I25118 Atherosclerotic heart disease of native coronary artery with other forms of angina pectoris: Secondary | ICD-10-CM

## 2020-05-04 NOTE — Patient Instructions (Signed)
Medication Instructions:  Your physician recommends that you continue on your current medications as directed. Please refer to the Current Medication list given to you today.  *If you need a refill on your cardiac medications before your next appointment, please call your pharmacy*   Lab Work: TODAY: CMET, LIPIDS  If you have labs (blood work) drawn today and your tests are completely normal, you will receive your results only by: Marland Kitchen MyChart Message (if you have MyChart) OR . A paper copy in the mail If you have any lab test that is abnormal or we need to change your treatment, we will call you to review the results.   Testing/Procedures: None    Follow-Up: At [facility], you and your health needs are our priority.  As part of our continuing mission to provide you with exceptional heart care, we have created designated Provider Care Teams.  These Care Teams include your primary Cardiologist (physician) and Advanced Practice Providers (APPs -  Physician Assistants and Nurse Practitioners) who all work together to provide you with the care you need, when you need it.  We recommend signing up for the patient portal called "MyChart".  Sign up information is provided on this After Visit Summary.  MyChart is used to connect with patients for Virtual Visits (Telemedicine).  Patients are able to view lab/test results, encounter notes, upcoming appointments, etc.  Non-urgent messages can be sent to your provider as well.   To learn more about what you can do with MyChart, go to [email or web address].    Your next appointment:   6 month(s)  The format for your next appointment:   In Person  Provider:   You may see Everette Rank, MD or one of the following Advanced Practice Providers on your designated Care Team:    Ronie Spies, PA-C  Jacolyn Reedy, PA-C    Other Instructions  High-Fiber Diet Fiber, also called dietary fiber, is a type of carbohydrate that is found in fruits,  vegetables, whole grains, and beans. A high-fiber diet can have many health benefits. Your health care provider may recommend a high-fiber diet to help:  Prevent constipation. Fiber can make your bowel movements more regular.  Lower your cholesterol.  Relieve the following conditions: ? Swelling of veins in the anus (hemorrhoids). ? Swelling and irritation (inflammation) of specific areas of the digestive tract (uncomplicated diverticulosis). ? A problem of the large intestine (colon) that sometimes causes pain and diarrhea (irritable bowel syndrome, IBS).  Prevent overeating as part of a weight-loss plan.  Prevent heart disease, type 2 diabetes, and certain cancers. What is my plan? The recommended daily fiber intake in grams (g) includes:  38 g for men age 57 or younger.  30 g for men over age 77.  25 g for women age 60 or younger.  21 g for women over age 43. You can get the recommended daily intake of dietary fiber by:  Eating a variety of fruits, vegetables, grains, and beans.  Taking a fiber supplement, if it is not possible to get enough fiber through your diet. What do I need to know about a high-fiber diet?  It is better to get fiber through food sources rather than from fiber supplements. There is not a lot of research about how effective supplements are.  Always check the fiber content on the nutrition facts label of any prepackaged food. Look for foods that contain 5 g of fiber or more per serving.  Talk with a diet and nutrition  specialist (dietitian) if you have questions about specific foods that are recommended or not recommended for your medical condition, especially if those foods are not listed below.  Gradually increase how much fiber you consume. If you increase your intake of dietary fiber too quickly, you may have bloating, cramping, or gas.  Drink plenty of water. Water helps you to digest fiber. What are tips for following this plan?  Eat a wide  variety of high-fiber foods.  Make sure that half of the grains that you eat each day are whole grains.  Eat breads and cereals that are made with whole-grain flour instead of refined flour or white flour.  Eat brown rice, bulgur wheat, or millet instead of white rice.  Start the day with a breakfast that is high in fiber, such as a cereal that contains 5 g of fiber or more per serving.  Use beans in place of meat in soups, salads, and pasta dishes.  Eat high-fiber snacks, such as berries, raw vegetables, nuts, and popcorn.  Choose whole fruits and vegetables instead of processed forms like juice or sauce. What foods can I eat?  Fruits Berries. Pears. Apples. Oranges. Avocado. Prunes and raisins. Dried figs. Vegetables Sweet potatoes. Spinach. Kale. Artichokes. Cabbage. Broccoli. Cauliflower. Green peas. Carrots. Squash. Grains Whole-grain breads. Multigrain cereal. Oats and oatmeal. Brown rice. Barley. Bulgur wheat. Millet. Quinoa. Bran muffins. Popcorn. Rye wafer crackers. Meats and other proteins Navy, kidney, and pinto beans. Soybeans. Split peas. Lentils. Nuts and seeds. Dairy Fiber-fortified yogurt. Beverages Fiber-fortified soy milk. Fiber-fortified orange juice. Other foods Fiber bars. The items listed above may not be a complete list of recommended foods and beverages. Contact a dietitian for more options. What foods are not recommended? Fruits Fruit juice. Cooked, strained fruit. Vegetables Fried potatoes. Canned vegetables. Well-cooked vegetables. Grains White bread. Pasta made with refined flour. White rice. Meats and other proteins Fatty cuts of meat. Fried chicken or fried fish. Dairy Milk. Yogurt. Cream cheese. Sour cream. Fats and oils Butters. Beverages Soft drinks. Other foods Cakes and pastries. The items listed above may not be a complete list of foods and beverages to avoid. Contact a dietitian for more information. Summary  Fiber is a type of  carbohydrate. It is found in fruits, vegetables, whole grains, and beans.  There are many health benefits of eating a high-fiber diet, such as preventing constipation, lowering blood cholesterol, helping with weight loss, and reducing your risk of heart disease, diabetes, and certain cancers.  Gradually increase your intake of fiber. Increasing too fast can result in cramping, bloating, and gas. Drink plenty of water while you increase your fiber.  The best sources of fiber include whole fruits and vegetables, whole grains, nuts, seeds, and beans. This information is not intended to replace advice given to you by your health care provider. Make sure you discuss any questions you have with your health care provider. Document Revised: 02/18/2017 Document Reviewed: 02/18/2017 Elsevier Patient Education  2020 [employer].

## 2020-05-05 LAB — LIPID PANEL
Chol/HDL Ratio: 3.3 ratio (ref 0.0–4.4)
Cholesterol, Total: 160 mg/dL (ref 100–199)
HDL: 48 mg/dL (ref >39)
LDL Chol Calc (NIH): 94 mg/dL (ref 0–99)
Triglycerides: 99 mg/dL (ref 0–149)
VLDL Cholesterol Cal: 18 mg/dL (ref 5–40)

## 2020-05-05 LAB — COMPREHENSIVE METABOLIC PANEL
ALT: 11 IU/L (ref 0–32)
AST: 17 IU/L (ref 0–40)
Albumin/Globulin Ratio: 2 (ref 1.2–2.2)
Albumin: 4.5 g/dL (ref 3.8–4.8)
Alkaline Phosphatase: 39 IU/L — ABNORMAL LOW (ref 44–121)
BUN/Creatinine Ratio: 16 (ref 12–28)
BUN: 20 mg/dL (ref 8–27)
Bilirubin Total: 0.2 mg/dL (ref 0.0–1.2)
CO2: 25 mmol/L (ref 20–29)
Calcium: 9.7 mg/dL (ref 8.7–10.3)
Chloride: 102 mmol/L (ref 96–106)
Creatinine, Ser: 1.27 mg/dL — ABNORMAL HIGH (ref 0.57–1.00)
GFR calc Af Amer: 51 mL/min/{1.73_m2} — ABNORMAL LOW (ref >59)
GFR calc non Af Amer: 44 mL/min/{1.73_m2} — ABNORMAL LOW (ref >59)
Globulin, Total: 2.3 g/dL (ref 1.5–4.5)
Glucose: 97 mg/dL (ref 65–99)
Potassium: 4.3 mmol/L (ref 3.5–5.2)
Sodium: 141 mmol/L (ref 134–144)
Total Protein: 6.8 g/dL (ref 6.0–8.5)

## 2020-05-10 ENCOUNTER — Telehealth: Payer: Self-pay

## 2020-05-10 DIAGNOSIS — I25118 Atherosclerotic heart disease of native coronary artery with other forms of angina pectoris: Secondary | ICD-10-CM

## 2020-05-10 DIAGNOSIS — E782 Mixed hyperlipidemia: Secondary | ICD-10-CM

## 2020-05-10 NOTE — Telephone Encounter (Signed)
Unable to lmom to schedule lipid appt as the pts voicemail isn't setup

## 2020-05-10 NOTE — Telephone Encounter (Signed)
Pt notified of MD recommendations.  Scheduled with lipid clinic for 05/17/2020 at 10:30.

## 2020-05-10 NOTE — Telephone Encounter (Signed)
-----   Message from Corky Crafts, MD sent at 05/09/2020 11:15 AM EST ----- OK to set up in lipid clinic to discus PCSK9 inhibitor.  JV ----- Message ----- From: Cheree Ditto, [facility] Sent: 05/06/2020   5:05 PM EST To: Corky Crafts, MD, Lattie Haw, RN  Zetia will get her close but unlikely less than 70.  Recommend PCSK9i ----- Message ----- From: Corky Crafts, MD Sent: 05/06/2020   2:06 PM EST To: Lattie Haw, RN, Cheree Ditto, [facility]  Would consider Zetia vs. PCSK9 inhibitor.  Not sure that Zetia would get hewr all the way below 70.  WIll check with PharmD.

## 2020-05-16 ENCOUNTER — Telehealth: Payer: Self-pay

## 2020-05-16 NOTE — Telephone Encounter (Signed)
UNABLE TO LMOM TO R/S

## 2020-05-17 ENCOUNTER — Ambulatory Visit: Payer: Medicare Other

## 2020-05-30 ENCOUNTER — Other Ambulatory Visit: Payer: Self-pay

## 2020-05-30 ENCOUNTER — Ambulatory Visit (INDEPENDENT_AMBULATORY_CARE_PROVIDER_SITE_OTHER): Payer: Medicare Other | Admitting: Pharmacist

## 2020-05-30 DIAGNOSIS — I25118 Atherosclerotic heart disease of native coronary artery with other forms of angina pectoris: Secondary | ICD-10-CM | POA: Diagnosis not present

## 2020-05-30 DIAGNOSIS — E782 Mixed hyperlipidemia: Secondary | ICD-10-CM | POA: Diagnosis not present

## 2020-05-30 MED ORDER — ATORVASTATIN CALCIUM 80 MG PO TABS: 80.0000 mg | ORAL_TABLET | Freq: Every day | ORAL | 3 refills | Status: DC

## 2020-05-30 MED ORDER — EZETIMIBE 10 MG PO TABS: 10.0000 mg | ORAL_TABLET | Freq: Every day | ORAL | 3 refills | Status: DC

## 2020-05-30 MED ORDER — TICAGRELOR 90 MG PO TABS: 90.0000 mg | ORAL_TABLET | Freq: Two times a day (BID) | ORAL | 3 refills | Status: DC

## 2020-05-30 NOTE — Progress Notes (Signed)
Patient ID: Andrea Riley                 DOB: 10-22-1955                    MRN: [id number]     HPI: Andrea Riley is a 65 y.o. female patient referred to lipid clinic by Dr. Eldridge Dace. PMH is significant for RCA MI w/ DES in November 2021, HLD, HTN, diabetes, thyroid disease and anxiety. Patient recently had a NSTEMI in November and her dose of atorvastatin was increased to 80mg  daily. Follow up lipid panel showed LDL remained above goal so pt was referred to lipid clinic for follow up.  Pt presents today in good spirits. Reports tolerating her atorvastatin 80mg  well. Local pharmacy was filling both 20mg  and 80mg  dose. She has not tried any other lipid lowering medications in the past. Her Brilinta cost also increased from ~$36 to > $200 at her local pharmacy so she did not pick up her refill.  Current Medications:  Atorvastatin 80mg  daily (per fill history, has also filled 20mg ) Fenofibrate 145mg  daily  Intolerances: No intolerances to lipid-lowering medications reported Risk Factors: NSTEMI, HTN, smoking history, HLD, diabetes LDL goal: <70  Diet:  Typically eats a late breakfast, early dinner and a midnight snack. Breakfast: Typically a peanut butter and banana sandwich on wheat bread. bacon, fried apples 1-2 times per week Dinner: Fish, chicken, salmon, steak once per week, with sides of squash, spinach, beans, mashed potatoes, corn. She prepares veggies steamed with chicken broth, no butter.  Snacks: A2 milk and 4-6 cookies. (Around 2am every morning)  Exercise: States she has not been exercising as much, she is afraid of falling with osteoporosis.   Family History: Diabetes in her mother; Heart disease in her mother; Hypertension in her father and mother; Stomach cancer in her maternal grandmother and mother; Thyroid disease in her mother.   Social History: Smokes 1-2 cigarettes or less per day. She has a 6.00 pack-year smoking history. She has never used smokeless tobacco.  She reports that she does not drink alcohol and does not use drugs.   Labs:   03/04/2020 05/04/2020  Medications atorvastatin 20mg  fenofibrate 145mg  atorvastatin 80mg  fenofibrate 145mg   Component Value Value   CHOL 144 160   TRIG 94 99   HDL 45 48   CHOLHDL 3.2 3.3   LDLCALC 80 94   LABVLDL 19 18     Past Medical History:  Diagnosis Date  . Anxiety   . Diabetes ([facility])   . Hyperlipemia   . Hypertension   . Insomnia   . Thyroid disease    Hypothyroidism    Current Outpatient Medications on File Prior to Visit  Medication Sig Dispense Refill  . ALPRAZolam (XANAX) 1 MG tablet Take 1 mg by mouth at bedtime as needed for sleep.    aspirin EC 81 MG tablet Take 1 tablet (81 mg total) by mouth daily. Swallow whole. 150 tablet 2  . esomeprazole (NEXIUM) 20 MG capsule Take 40 mg by mouth daily at 12 noon. Sometimes takes a extra dose around 3pm    . metoprolol tartrate (LOPRESSOR) 25 MG tablet Take 0.5 tablets (12.5 mg total) by mouth 2 (two) times daily. 120 tablet 3  . nitroGLYCERIN (NITROSTAT) 0.4 MG SL tablet Place 1 tablet (0.4 mg total) under the tongue every 5 (five) minutes x 3 doses as needed for chest pain. 25 tablet 3  . sertraline (ZOLOFT) 50  MG tablet Take 50 mg by mouth daily.    . valsartan-hydrochlorothiazide (DIOVAN HCT) 80-12.5 MG tablet Take 0.5 tablets by mouth daily. Begin on 03/05/20.     No current facility-administered medications on file prior to visit.    Allergies  Allergen Reactions  . Latex Itching  . Penicillins Swelling and Other (See Comments)    Itching  . Vicodin [Hydrocodone-Acetaminophen] Other (See Comments)    Hallucinations     Assessment/Plan:  1. Hyperlipidemia - LDL remains elevated at 94 on atorvastatin 80mg  daily, above goal < 70 given history of ASCVD. Discussed PCSK9i therapy, Nexlizet, and Nexletol with pt. She does not wish to give herself injections. Nexlizet is cost prohibitive for pt. She is agreeable to start ezetimibe 10mg   once daily. Will also stop fenofibrate since her TG are consistently < 100. Patient counseled on the importance of healthy diet and exercise. She makes healthy eating a priority. We discussed replacing some of her nighttime cookies with fruit or a low-sugar yogurt. She is going to take walks around the neighborhood and do some chair exercises at home. Recheck fasting lipid panel and LFTs in 3 months.  2. Tobacco abuse - Pt smoking 1-2 cigarettes per day. Discussed smoking cessation and the importance of quitting. Pt is aware and will work to cut back on tobacco use.  3. Med copays - Brilinta copay increased from $36 to > $200. She has which only allows her to fill 2 fills at her local pharmacy before she needs to change to mail order. Rx has been sent in, copay will go back to $36. Pt has the same copay for 1 month vs 3 month supply. Pt had been prescribed rx as 1 month so rx for 3 month fill has been sent in for even more cost savings. Provided pt with 2 weeks of samples so that she doesn't have a gap in her antiplatelet therapy.  [employer], PharmD PGY1 Pharmacy Resident 05/30/2020 11:30 AM   Megan E. Supple, PharmD, BCACP, CPP [facility] [address] [place], [address] [place] Phone: [phone]; Fax: [phone] 05/30/2020 11:43 AM

## 2020-05-30 NOTE — Patient Instructions (Addendum)
It was nice to meet you today  Your LDL is 94 and your goal is < 70 -Continue taking atorvastatin 80mg  once daily -Start taking ezetimibe 10mg  once daily  Your triglycerides are 99 and your goal is < 150 -Stop taking fenofibrate  -Try to cut back on sweets in your diet  Recheck fasting cholesterol panel on Monday, April 18th any time after 7:30am

## 2020-07-14 ENCOUNTER — Telehealth: Payer: Self-pay | Admitting: Interventional Cardiology

## 2020-07-14 NOTE — Telephone Encounter (Signed)
Per Dr. Eldridge Dace, pt does not need antibiotics prior to dental cleaning.  Spoke with Dr. Shannan Harper office and made them aware.

## 2020-07-14 NOTE — Telephone Encounter (Signed)
   [facility] Pre-operative Risk Assessment    Request for surgical clearance:  What type of surgery is being performed?  cleaning  1. When is this surgery scheduled? Today, in chair now  2. What type of clearance is required (medical clearance vs. Pharmacy clearance to hold med vs. Both)? unsure  3. Are there any medications that need to be held prior to surgery and how long? Not that they are aware  4. Practice name and name of physician performing surgery? Dr Darnelle Maffucci [facility]  5. What is your office phone number [phone]   7.   What is your office fax number [phone]  8.   Anesthesia type (None, local, MAC, general) ? none   Andrea Riley 07/14/2020, 9:23 AM  _________________________________________________________________   (provider comments below)

## 2020-08-15 ENCOUNTER — Other Ambulatory Visit: Payer: Medicare Other | Admitting: *Deleted

## 2020-08-15 ENCOUNTER — Other Ambulatory Visit: Payer: Self-pay

## 2020-08-15 DIAGNOSIS — I25118 Atherosclerotic heart disease of native coronary artery with other forms of angina pectoris: Secondary | ICD-10-CM

## 2020-08-15 LAB — HEPATIC FUNCTION PANEL
ALT: 21 IU/L (ref 0–32)
AST: 20 IU/L (ref 0–40)
Albumin: 4.3 g/dL (ref 3.8–4.8)
Alkaline Phosphatase: 61 IU/L (ref 44–121)
Bilirubin Total: 0.2 mg/dL (ref 0.0–1.2)
Bilirubin, Direct: <0.1 mg/dL (ref 0.00–0.40)
Total Protein: 6.7 g/dL (ref 6.0–8.5)

## 2020-08-15 LAB — LIPID PANEL
Chol/HDL Ratio: 3.7 ratio (ref 0.0–4.4)
Cholesterol, Total: 142 mg/dL (ref 100–199)
HDL: 38 mg/dL — ABNORMAL LOW (ref >39)
LDL Chol Calc (NIH): 74 mg/dL (ref 0–99)
Triglycerides: 174 mg/dL — ABNORMAL HIGH (ref 0–149)
VLDL Cholesterol Cal: 30 mg/dL (ref 5–40)

## 2020-09-19 ENCOUNTER — Ambulatory Visit: Payer: Medicare Other | Admitting: Interventional Cardiology

## 2020-09-27 NOTE — Progress Notes (Signed)
Cardiology Office Note   Date:  09/30/2020   ID:  Andrea Riley, Andrea Riley 1955/11/28, MRN [id number]  PCP:  Marva Panda, NP    No chief complaint on file.  CAD  Wt Readings from Last 3 Encounters:  09/30/20 150 lb 3.2 oz (68.1 kg)  05/04/20 146 lb (66.2 kg)  03/21/20 148 lb (67.1 kg)       History of Present Illness: Andrea Riley is a 65 y.o. female  With h/o CAD.   She had an inferior MI in 11/21:  "Mid Cx to Dist Cx lesion is 50% stenosed.  Prox LAD to Mid LAD lesion is 25% stenosed.  2nd Mrg lesion is 25% stenosed.  Post intervention, there is a 0% residual stenosis.  Prox RCA lesion is 50% stenosed.  Dist RCA lesion is 25% stenosed.  Prox RCA to Mid RCA lesion is 100% stenosed. This is the culprit vessel.  After large thrombus removal with aspiration thrombectomy, a drug-eluting stent was successfully placed using a SYNERGY XD 3.0X48, postdilated to 4 mm proximally. Stent optimized with IVUS.  The left ventricular ejection fraction is 45-50% by visual estimate. Inferior hypokinesis.  LV end diastolic pressure is mildly elevated.  There is mild left ventricular systolic dysfunction.  There is no aortic valve stenosis.  Severe tortuosity of right subclavian necessitating use of 75 Fr slender sheath.  Single vessel CAD. Increase atorvastatin. Start low dose beta blocker along with DAPT. Holding ARB until BP stabilizes, and we check renal function post cath. Check A1C. "  She did have her COVID vaccines.  She was hesitant to go back to the gym due to COVID after her MI.  She still needs a wisdom tooth extraction.    Denies : Chest pain. Dizziness. Leg edema. Nitroglycerin use. Orthopnea. Palpitations. Paroxysmal nocturnal dyspnea. Shortness of breath. Syncope.   Not exercising much.  Walks in the house and in the yard.    No bleeding issues.     Past Medical History:  Diagnosis Date  . Anxiety   . Diabetes (HCC)   . Hyperlipemia   .  Hypertension   . Insomnia   . Thyroid disease    Hypothyroidism    Past Surgical History:  Procedure Laterality Date  . ABDOMINAL HYSTERECTOMY  2003   total  . CARPAL TUNNEL RELEASE Bilateral 08/2011 & 04/2012  . CATARACT EXTRACTION Bilateral    and lens replacement  . CESAREAN SECTION    . CORONARY/GRAFT ACUTE MI REVASCULARIZATION N/A 03/03/2020   Procedure: Coronary/Graft Acute MI Revascularization;  Surgeon: Corky Crafts, MD;  Location: [facility] INVASIVE CV LAB;  Service: Cardiovascular;  Laterality: N/A;  . INTRAVASCULAR ULTRASOUND/IVUS N/A 03/03/2020   Procedure: Intravascular Ultrasound/IVUS;  Surgeon: Corky Crafts, MD;  Location: [facility] INVASIVE CV LAB;  Service: Cardiovascular;  Laterality: N/A;  . LEFT HEART CATH AND CORONARY ANGIOGRAPHY N/A 03/03/2020   Procedure: LEFT HEART CATH AND CORONARY ANGIOGRAPHY;  Surgeon: Corky Crafts, MD;  Location: [facility] INVASIVE CV LAB;  Service: Cardiovascular;  Laterality: N/A;  . TOTAL HIP ARTHROPLASTY Right 1996     Current Outpatient Medications  Medication Sig Dispense Refill  . ALPRAZolam (XANAX) 1 MG tablet Take 1 mg by mouth at bedtime as needed for sleep.    Marland Kitchen aspirin EC 81 MG tablet Take 1 tablet (81 mg total) by mouth daily. Swallow whole. 150 tablet 2  . atorvastatin (LIPITOR) 80 MG tablet Take 1 tablet (80 mg total) by mouth daily. 90 tablet  3  . esomeprazole (NEXIUM) 20 MG capsule Take 40 mg by mouth daily at 12 noon. Sometimes takes a extra dose around 3pm    . ezetimibe (ZETIA) 10 MG tablet Take 1 tablet (10 mg total) by mouth daily. 90 tablet 3  . metoprolol tartrate (LOPRESSOR) 25 MG tablet Take 0.5 tablets (12.5 mg total) by mouth 2 (two) times daily. 120 tablet 3  . nitroGLYCERIN (NITROSTAT) 0.4 MG SL tablet Place 1 tablet (0.4 mg total) under the tongue every 5 (five) minutes x 3 doses as needed for chest pain. 25 tablet 3  . sertraline (ZOLOFT) 50 MG tablet Take 50 mg by mouth daily.    . ticagrelor (BRILINTA) 90  MG TABS tablet Take 1 tablet (90 mg total) by mouth 2 (two) times daily. 180 tablet 3  . valsartan-hydrochlorothiazide (DIOVAN HCT) 80-12.5 MG tablet Take 0.5 tablets by mouth daily. Begin on 03/05/20.     No current facility-administered medications for this visit.    Allergies:   Latex, Penicillins, and Vicodin [hydrocodone-acetaminophen]    Social History:  The patient  reports that she has been smoking cigarettes. She has a 6.00 pack-year smoking history. She has never used smokeless tobacco. She reports that she does not drink alcohol and does not use drugs.   Family History:  The patient's family history includes Diabetes in her mother; Heart disease in her mother; Hypertension in her father and mother; Stomach cancer in her maternal grandmother and mother; Thyroid disease in her mother.    ROS:  Please see the history of present illness.   Otherwise, review of systems are positive for tooth pain.   All other systems are reviewed and negative.    PHYSICAL EXAM: VS:  BP 112/62   Pulse 72   Ht 5' 2.5" (1.588 m)   Wt 150 lb 3.2 oz (68.1 kg)   SpO2 97%   BMI 27.03 kg/m  , BMI Body mass index is 27.03 kg/m. GEN: Well nourished, well developed, in no acute distress  HEENT: normal  Neck: no JVD, carotid bruits, or masses Cardiac: RRR; no murmurs, rubs, or gallops,no edema  Respiratory:  clear to auscultation bilaterally, normal work of breathing GI: soft, nontender, nondistended, + BS MS: no deformity or atrophy ; absent right radial pulse, 2+ right ulnar pulse Skin: warm and dry, no rash Neuro:  Strength and sensation are intact Psych: euthymic mood, full affect     Recent Labs: 03/21/2020: Hemoglobin 12.3; Platelets 389 05/04/2020: BUN 20; Creatinine, Ser 1.27; Potassium 4.3; Sodium 141 08/15/2020: ALT 21   Lipid Panel    Component Value Date/Time   CHOL 142 08/15/2020 0827   TRIG 174 (H) 08/15/2020 0827   HDL 38 (L) 08/15/2020 0827   CHOLHDL 3.7 08/15/2020 0827    CHOLHDL 3.2 03/04/2020 0036   VLDL 19 03/04/2020 0036   LDLCALC 74 08/15/2020 0827     Other studies Reviewed: Additional studies/ records that were reviewed today with results demonstrating: cath records reviewed.   ASSESSMENT AND PLAN:  1. CAD/old MI: Continue aggressive secondary prevention.  Instructions for dual antiplatelet therapy noted below.  In addition, she had severe right subclavian tortuosity so would not use right radial approach in the future.  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  She does have a strong ulnar pulse. 2. Hyperlipidemia: Whole food, plant-based diet.  Continue high-dose statin.  LDL 74 in 07/2020. 3. Hypertension: Low-salt diet.  Exercise at target noted  below. 4. Preoperative cardiovascular examination: No angina.  She has a somewhat urgent dental procedure.  Since it has been > 60 months, will hold Brilinta for 5 days, continue aspirin 81 mg and she can have her tooth extraction.  She is clear she cannot wait any longer for this to be done.  Of note, she has been taking BC powders.  I cautioned her to avoid these due to risk of stomach bleeding.  Acetaminophen would be a better choice for short term pain.  Dr. Alvester Morin 336 [phone].  Restart Brilinta day after tooth extraction.    Current medicines are reviewed at length with the patient today.  The patient concerns regarding her medicines were addressed.  The following changes have been made: Holding Brilinta prior to tooth extraction.  Restart the day after the tooth extraction.  Continue baby aspirin during this time.  Labs/ tests ordered today include:  No orders of the defined types were placed in this encounter.   Recommend 150 minutes/week of aerobic exercise Low fat, low carb, high fiber diet recommended  Disposition:   FU in 11/22.   Signed, Lance Muss, MD  09/30/2020 9:23 AM    [facility] [address] [place], [place], [place]   [address] Phone: [phone]; Fax: [phone]

## 2020-09-30 ENCOUNTER — Encounter: Payer: Self-pay | Admitting: Interventional Cardiology

## 2020-09-30 ENCOUNTER — Other Ambulatory Visit: Payer: Self-pay

## 2020-09-30 ENCOUNTER — Ambulatory Visit (INDEPENDENT_AMBULATORY_CARE_PROVIDER_SITE_OTHER): Payer: Medicare Other | Admitting: Interventional Cardiology

## 2020-09-30 VITALS — BP 112/62 | HR 72 | Ht 62.5 in | Wt 150.2 lb

## 2020-09-30 DIAGNOSIS — Z955 Presence of coronary angioplasty implant and graft: Secondary | ICD-10-CM

## 2020-09-30 DIAGNOSIS — E782 Mixed hyperlipidemia: Secondary | ICD-10-CM

## 2020-09-30 DIAGNOSIS — I10 Essential (primary) hypertension: Secondary | ICD-10-CM

## 2020-09-30 DIAGNOSIS — I252 Old myocardial infarction: Secondary | ICD-10-CM | POA: Diagnosis not present

## 2020-09-30 DIAGNOSIS — I25118 Atherosclerotic heart disease of native coronary artery with other forms of angina pectoris: Secondary | ICD-10-CM | POA: Diagnosis not present

## 2020-09-30 NOTE — Patient Instructions (Signed)
Medication Instructions:  Your provider recommends that you continue on your current medications as directed. Please refer to the Current Medication list given to you today.    For your upcoming tooth extraction, please STOP BRILINTA after today. You will restart Brilinta the day after your extraction. Continue your Aspirin. *If you need a refill on your cardiac medications before your next appointment, please call your pharmacy*   Follow-Up: At [facility], you and your health needs are our priority.  As part of our continuing mission to provide you with exceptional heart care, we have created designated Provider Care Teams.  These Care Teams include your primary Cardiologist (physician) and Advanced Practice Providers (APPs -  Physician Assistants and Nurse Practitioners) who all work together to provide you with the care you need, when you need it. Your next appointment:   5 month(s) The format for your next appointment:   In Person Provider:   Everette Rank, MD

## 2021-02-28 NOTE — Progress Notes (Signed)
Cardiology Office Note   Date:  03/01/2021   ID:  Afrika, Brick 1955/10/01, MRN [id number]  PCP:  Marva Panda, NP    No chief complaint on file.  CAD  Wt Readings from Last 3 Encounters:  03/01/21 150 lb (68 kg)  09/30/20 150 lb 3.2 oz (68.1 kg)  05/04/20 146 lb (66.2 kg)       History of Present Illness: Andrea Riley is a 65 y.o. female  With h/o CAD.    She had an inferior MI in 11/21: "Mid Cx to Dist Cx lesion is 50% stenosed. Prox LAD to Mid LAD lesion is 25% stenosed. 2nd Mrg lesion is 25% stenosed. Post intervention, there is a 0% residual stenosis. Prox RCA lesion is 50% stenosed. Dist RCA lesion is 25% stenosed. Prox RCA to Mid RCA lesion is 100% stenosed. This is the culprit vessel. After large thrombus removal with aspiration thrombectomy, a drug-eluting stent was successfully placed using a SYNERGY XD 3.0X48, postdilated to 4 mm proximally. Stent optimized with IVUS. The left ventricular ejection fraction is 45-50% by visual estimate. Inferior hypokinesis. LV end diastolic pressure is mildly elevated. There is mild left ventricular systolic dysfunction. There is no aortic valve stenosis. Severe tortuosity of right subclavian necessitating use of 75 Fr slender sheath.   Single vessel CAD.  Increase atorvastatin.  Start low dose beta blocker along with DAPT.  Holding ARB until BP stabilizes, and we check renal function post cath.  Check A1C. "    She did have her COVID vaccines.  She was hesitant to go back to the gym due to COVID after her MI.   She still needs a wisdom tooth extraction.     Denies : Chest pain. Dizziness. Leg edema. Nitroglycerin use. Orthopnea. Palpitations. Paroxysmal nocturnal dyspnea. Shortness of breath. Syncope.     Past Medical History:  Diagnosis Date   Anxiety    Diabetes (HCC)    Hyperlipemia    Hypertension    Insomnia    Thyroid disease    Hypothyroidism    Past Surgical History:  Procedure  Laterality Date   ABDOMINAL HYSTERECTOMY  2003   total   CARPAL TUNNEL RELEASE Bilateral 08/2011 & 04/2012   CATARACT EXTRACTION Bilateral    and lens replacement   CESAREAN SECTION     CORONARY/GRAFT ACUTE MI REVASCULARIZATION N/A 03/03/2020   Procedure: Coronary/Graft Acute MI Revascularization;  Surgeon: Corky Crafts, MD;  Location: [facility] INVASIVE CV LAB;  Service: Cardiovascular;  Laterality: N/A;   INTRAVASCULAR ULTRASOUND/IVUS N/A 03/03/2020   Procedure: Intravascular Ultrasound/IVUS;  Surgeon: Corky Crafts, MD;  Location: [facility] INVASIVE CV LAB;  Service: Cardiovascular;  Laterality: N/A;   LEFT HEART CATH AND CORONARY ANGIOGRAPHY N/A 03/03/2020   Procedure: LEFT HEART CATH AND CORONARY ANGIOGRAPHY;  Surgeon: Corky Crafts, MD;  Location: [facility] INVASIVE CV LAB;  Service: Cardiovascular;  Laterality: N/A;   TOTAL HIP ARTHROPLASTY Right 1996     Current Outpatient Medications  Medication Sig Dispense Refill   ALPRAZolam (XANAX) 1 MG tablet Take 1 mg by mouth at bedtime as needed for sleep.     aspirin EC 81 MG tablet Take 1 tablet (81 mg total) by mouth daily. Swallow whole. 150 tablet 2   atorvastatin (LIPITOR) 80 MG tablet Take 1 tablet (80 mg total) by mouth daily. 90 tablet 3   esomeprazole (NEXIUM) 20 MG capsule Take 40 mg by mouth daily at 12 noon. Sometimes takes a extra dose around  3pm     ezetimibe (ZETIA) 10 MG tablet Take 1 tablet (10 mg total) by mouth daily. 90 tablet 3   glipiZIDE (GLUCOTROL) 10 MG tablet Take 10 mg by mouth daily before breakfast.     metoprolol tartrate (LOPRESSOR) 25 MG tablet Take 0.5 tablets (12.5 mg total) by mouth 2 (two) times daily. 120 tablet 3   nitroGLYCERIN (NITROSTAT) 0.4 MG SL tablet Place 1 tablet (0.4 mg total) under the tongue every 5 (five) minutes x 3 doses as needed for chest pain. 25 tablet 3   sertraline (ZOLOFT) 50 MG tablet Take 50 mg by mouth daily.     ticagrelor (BRILINTA) 90 MG TABS tablet Take 1 tablet (90 mg  total) by mouth 2 (two) times daily. 180 tablet 3   valsartan-hydrochlorothiazide (DIOVAN HCT) 80-12.5 MG tablet Take 0.5 tablets by mouth daily. Begin on 03/05/20.     No current facility-administered medications for this visit.    Allergies:   Latex, Penicillins, and Vicodin [hydrocodone-acetaminophen]    Social History:  The patient  reports that she has been smoking cigarettes. She has a 6.00 pack-year smoking history. She has never used smokeless tobacco. She reports that she does not drink alcohol and does not use drugs.   Family History:  The patient's family history includes Diabetes in her mother; Heart disease in her mother; Hypertension in her father and mother; Stomach cancer in her maternal grandmother and mother; Thyroid disease in her mother.    ROS:  Please see the history of present illness.   Otherwise, review of systems are positive for started on glipizide.   All other systems are reviewed and negative.    PHYSICAL EXAM: VS:  BP 104/70   Pulse (!) 55   Ht 5' 2.5" (1.588 m)   Wt 150 lb (68 kg)   SpO2 98%   BMI 27.00 kg/m  , BMI Body mass index is 27 kg/m. GEN: Well nourished, well developed, in no acute distress HEENT: normal Neck: no JVD, carotid bruits, or masses Cardiac: RRR; no murmurs, rubs, or gallops,no edema  Respiratory:  clear to auscultation bilaterally, normal work of breathing GI: soft, nontender, nondistended, + BS MS: no deformity or atrophy Skin: warm and dry, no rash Neuro:  Strength and sensation are intact Psych: euthymic mood, full affect   EKG:   The ekg ordered today demonstrates sinus bradycardia, nonspecific ST changes   Recent Labs: 03/21/2020: Hemoglobin 12.3; Platelets 389 05/04/2020: BUN 20; Creatinine, Ser 1.27; Potassium 4.3; Sodium 141 08/15/2020: ALT 21   Lipid Panel    Component Value Date/Time   CHOL 142 08/15/2020 0827   TRIG 174 (H) 08/15/2020 0827   HDL 38 (L) 08/15/2020 0827   CHOLHDL 3.7 08/15/2020 0827    CHOLHDL 3.2 03/04/2020 0036   VLDL 19 03/04/2020 0036   LDLCALC 74 08/15/2020 0827     Other studies Reviewed: Additional studies/ records that were reviewed today with results demonstrating: labs reviewed.   ASSESSMENT AND PLAN:  CAD/Old MI: No angina. Continue aggressive secondary prevention. No bleeding problems. Stop Brilinta when current supply is done.  Then start clopidogrel 75 mg daily instead.  Hyperlipidemia: LDL 74. Continue high dose statin. High fiber diet to help with lipids and with sugar. Hypertension: The current medical regimen is effective;  continue present plan and medications. DM: A1C went to > 7 per her report and she was started on glipizide.  I suggested metformin instead due to CV benefits.  Walking to the target  noted below. Could try stationary bike given her fear of falling. Increased eercise will help with DM management    Current medicines are reviewed at length with the patient today.  The patient concerns regarding her medicines were addressed.  The following changes have been made: as above  Labs/ tests ordered today include:  No orders of the defined types were placed in this encounter.   Recommend 150 minutes/week of aerobic exercise Low fat, low carb, high fiber diet recommended  Disposition:   FU in 1 year   Signed, Lance Muss, MD  03/01/2021 10:33 AM    [facility] [address] [place], [place], [place]  [address] Phone: [phone]; Fax: [phone]

## 2021-03-01 ENCOUNTER — Ambulatory Visit (INDEPENDENT_AMBULATORY_CARE_PROVIDER_SITE_OTHER): Payer: Medicare Other | Admitting: Interventional Cardiology

## 2021-03-01 ENCOUNTER — Encounter: Payer: Self-pay | Admitting: Interventional Cardiology

## 2021-03-01 ENCOUNTER — Other Ambulatory Visit: Payer: Self-pay

## 2021-03-01 VITALS — BP 104/70 | HR 55 | Ht 62.5 in | Wt 150.0 lb

## 2021-03-01 DIAGNOSIS — I10 Essential (primary) hypertension: Secondary | ICD-10-CM | POA: Diagnosis not present

## 2021-03-01 DIAGNOSIS — I25118 Atherosclerotic heart disease of native coronary artery with other forms of angina pectoris: Secondary | ICD-10-CM | POA: Diagnosis not present

## 2021-03-01 DIAGNOSIS — I252 Old myocardial infarction: Secondary | ICD-10-CM

## 2021-03-01 DIAGNOSIS — E782 Mixed hyperlipidemia: Secondary | ICD-10-CM

## 2021-03-01 MED ORDER — CLOPIDOGREL BISULFATE 75 MG PO TABS: 75.0000 mg | ORAL_TABLET | Freq: Every day | ORAL | 3 refills | Status: DC

## 2021-03-01 NOTE — Patient Instructions (Addendum)
Medication Instructions:  1.Stop brilinta once you finish the supply that you have on hand. Once you finish: 2.Start Clopidogrel (Plavix) 75 mg, take one tablet by mouth daily *If you need a refill on your cardiac medications before your next appointment, please call your pharmacy*   Lab Work: None If you have labs (blood work) drawn today and your tests are completely normal, you will receive your results only by: MyChart Message (if you have MyChart) OR A paper copy in the mail If you have any lab test that is abnormal or we need to change your treatment, we will call you to review the results.   Follow-Up: At [facility], you and your health needs are our priority.  As part of our continuing mission to provide you with exceptional heart care, we have created designated Provider Care Teams.  These Care Teams include your primary Cardiologist (physician) and Advanced Practice Providers (APPs -  Physician Assistants and Nurse Practitioners) who all work together to provide you with the care you need, when you need it.   Your next appointment:   1 year(s)  The format for your next appointment:   In Person  Provider:   Everette Rank, MD

## 2021-04-28 ENCOUNTER — Other Ambulatory Visit: Payer: Self-pay

## 2021-04-28 ENCOUNTER — Telehealth: Payer: Self-pay | Admitting: Interventional Cardiology

## 2021-04-28 MED ORDER — CLOPIDOGREL BISULFATE 75 MG PO TABS: 75.0000 mg | ORAL_TABLET | Freq: Every day | ORAL | 3 refills | Status: DC

## 2021-04-28 NOTE — Telephone Encounter (Signed)
*  STAT* If patient is at the pharmacy, call can be transferred to refill team.   1. Which medications need to be refilled? (please list name of each medication and dose if known) clopidogrel (PLAVIX) 75 MG tablet  2. Which pharmacy/location (including street and city if local pharmacy) is medication to be sent to? [facility] - [place], [place] - [address]  3. Do they need a 30 day or 90 day supply? 90 day  

## 2021-05-08 ENCOUNTER — Other Ambulatory Visit: Payer: Self-pay | Admitting: Interventional Cardiology

## 2021-06-23 ENCOUNTER — Other Ambulatory Visit: Payer: Self-pay | Admitting: Interventional Cardiology

## 2022-01-08 IMAGING — MG DIGITAL SCREENING BILAT W/ TOMO W/ CAD
8 series · 8 of 24 positions shown · non-contrast
Comparison: Previous exam(s).

CLINICAL DATA: Screening.

EXAM:
DIGITAL SCREENING BILATERAL MAMMOGRAM WITH TOMO AND CAD

[R MLO synth-2D]
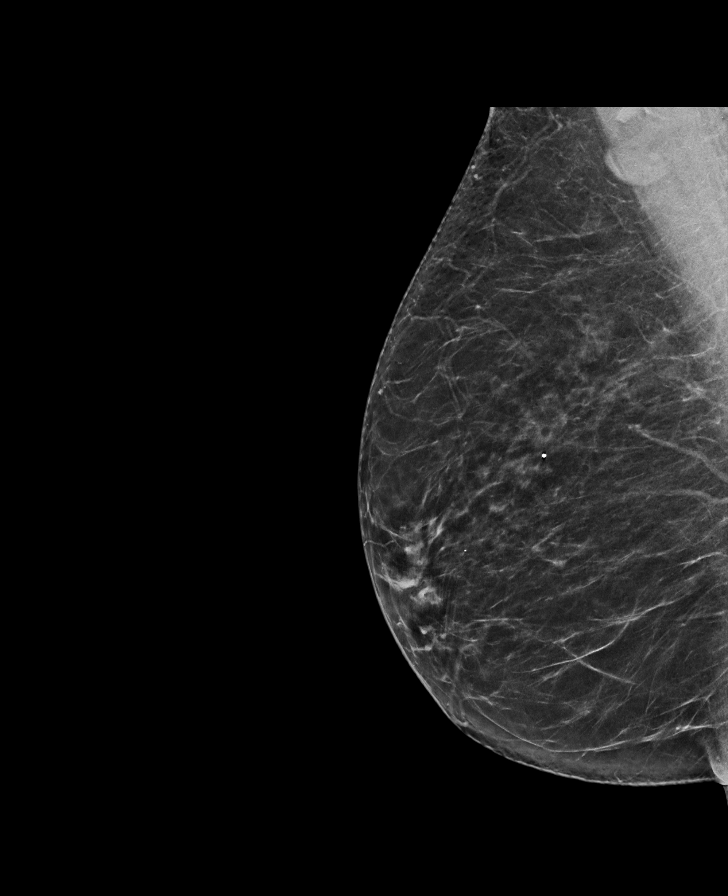

[L MLO synth-2D]
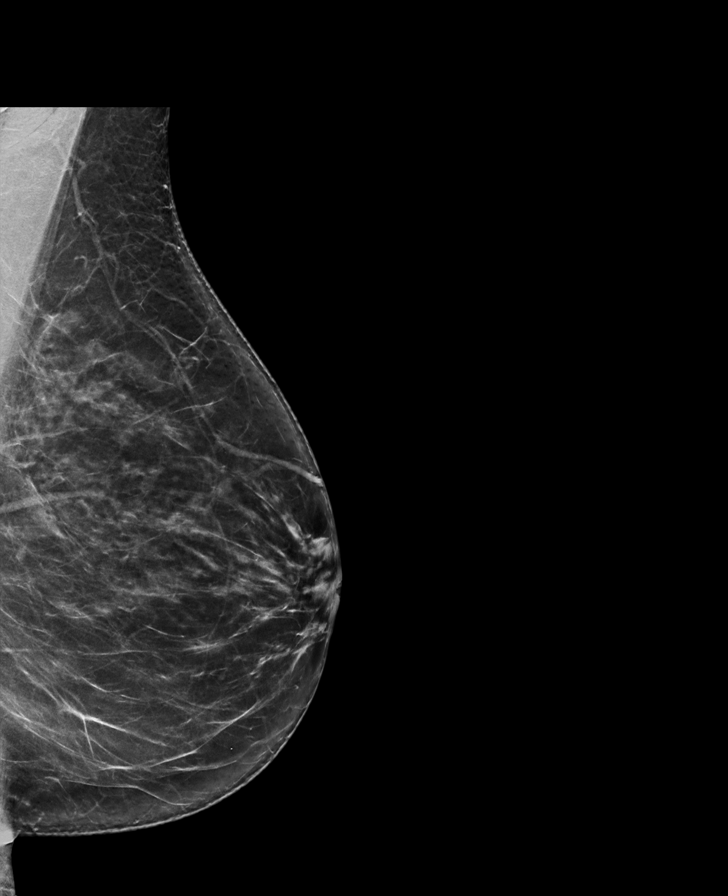

[L CC synth-2D]
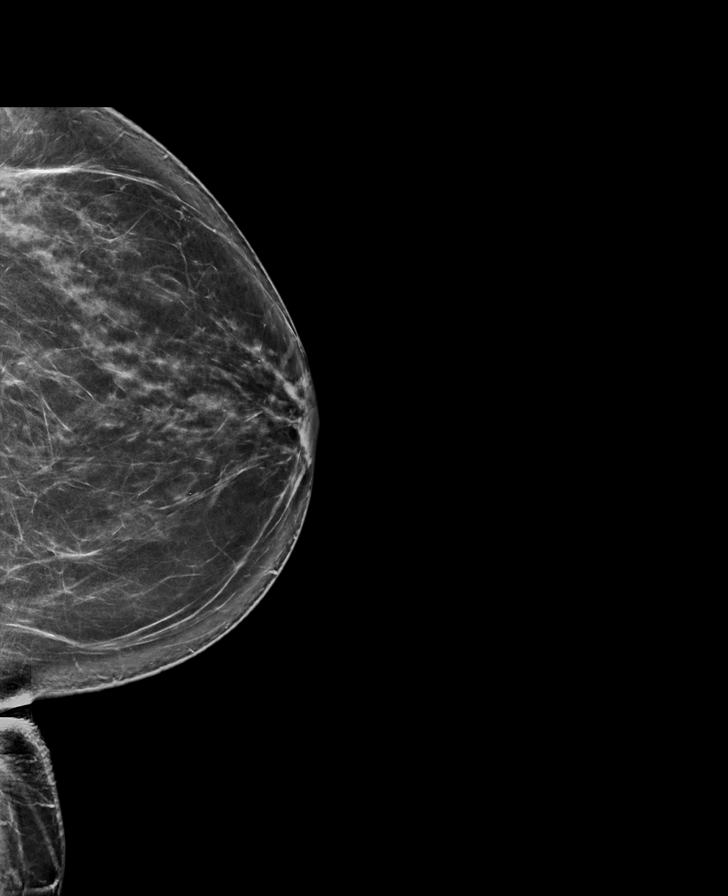

[R CC synth-2D]
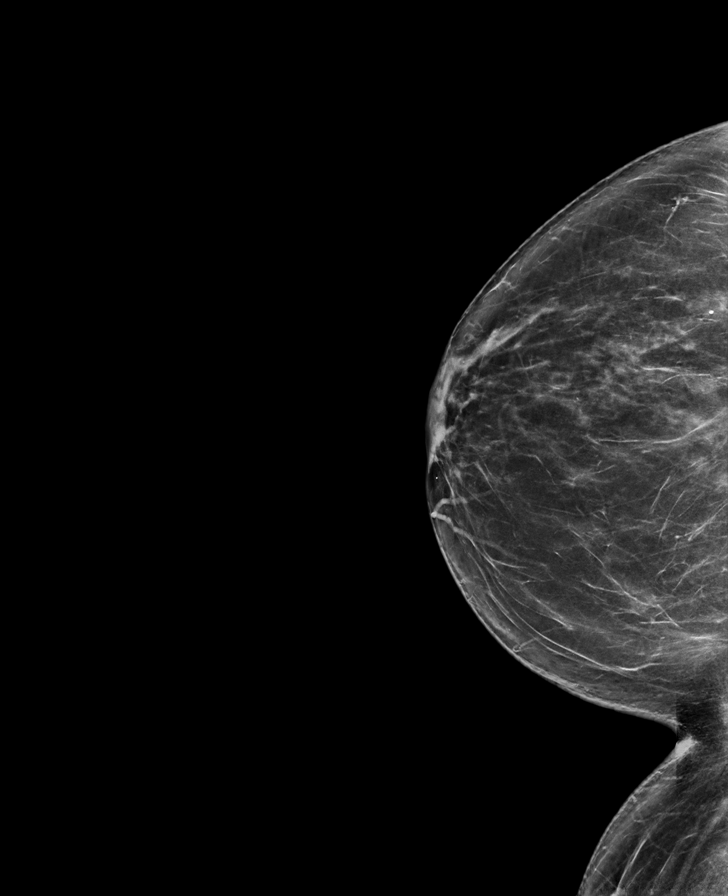

[R MLO tomo · tomo slice 37/73.0]
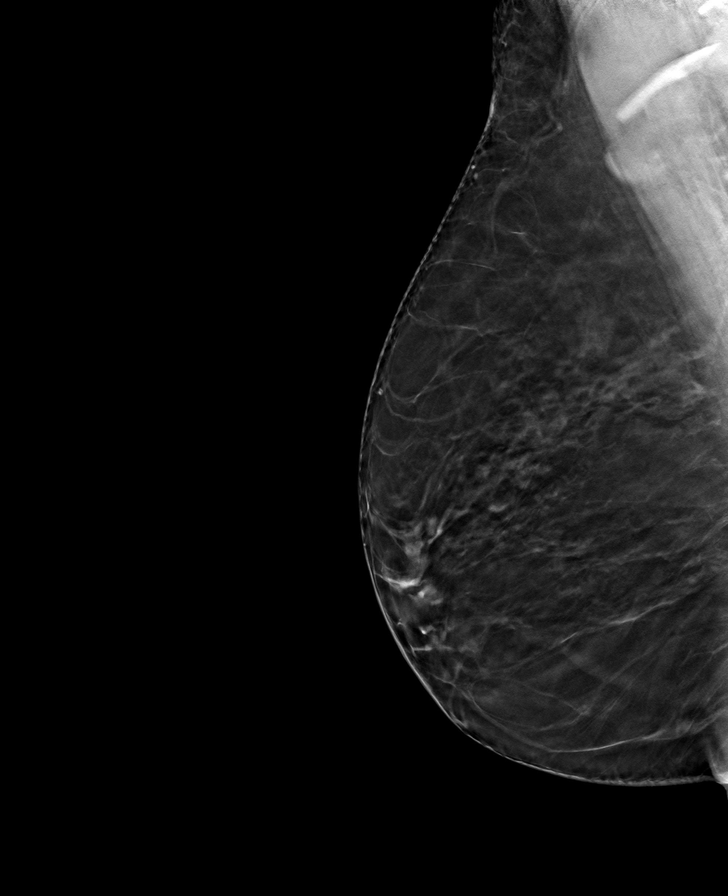

[L CC tomo · tomo slice 40/79.0]
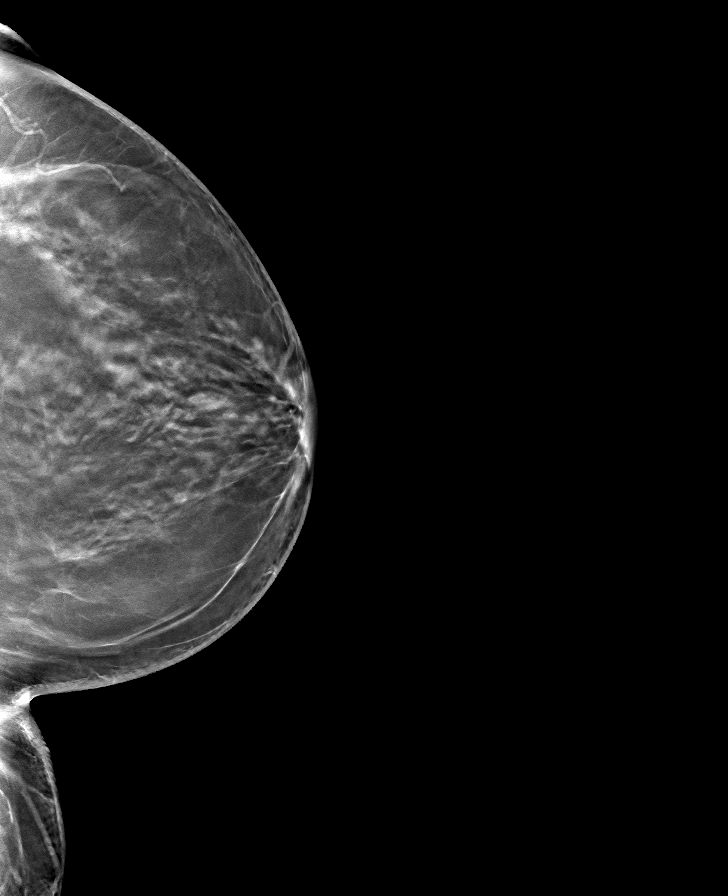

[L MLO tomo · tomo slice 38/75.0]
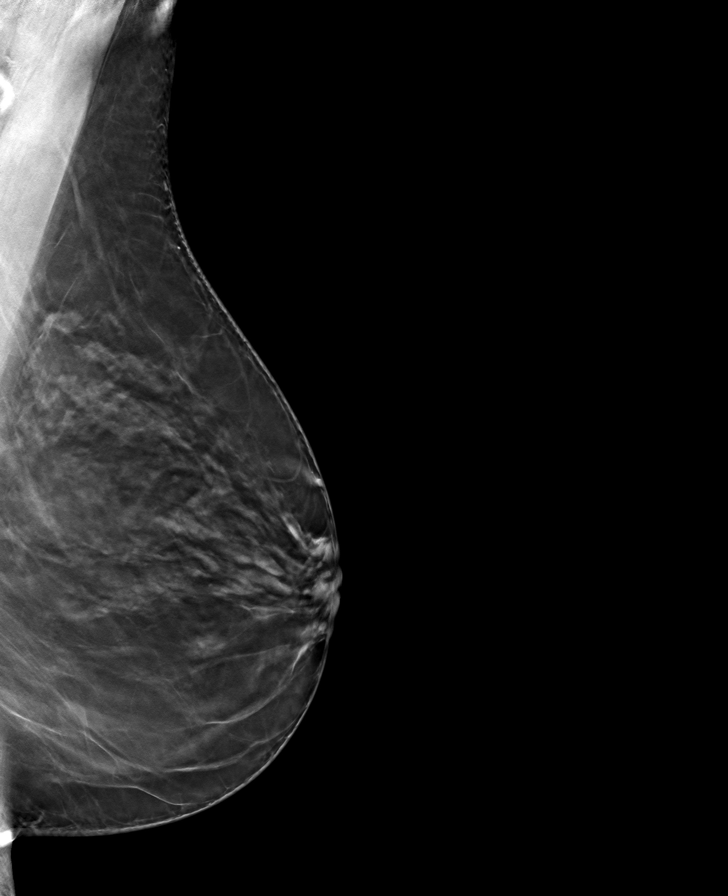

[R CC tomo · tomo slice 39/76.0]
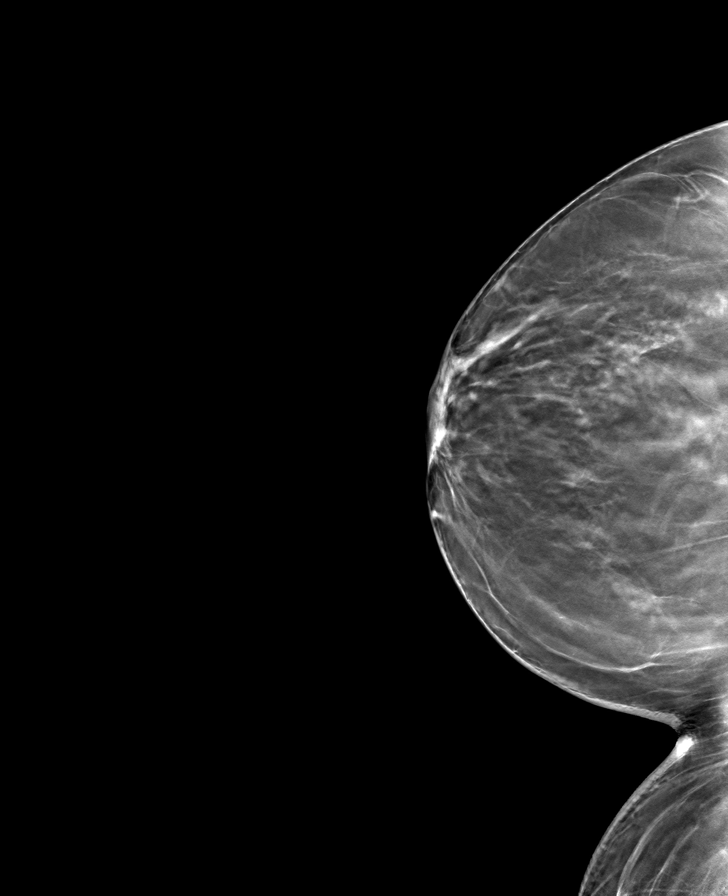

[8 of 24 positions shown; findings below may reference images not displayed]

ACR Breast Density Category b: There are scattered areas of
fibroglandular density.
FINDINGS: There are no findings suspicious for malignancy. Images were
processed with CAD.
IMPRESSION: No mammographic evidence of malignancy. A result letter of this
screening mammogram will be mailed directly to the patient.

RECOMMENDATION:
Screening mammogram in one year. (Code:[id number])

BI-RADS CATEGORY  1: Negative.

## 2022-02-28 ENCOUNTER — Encounter: Payer: Self-pay | Admitting: Obstetrics and Gynecology

## 2022-02-28 ENCOUNTER — Ambulatory Visit (INDEPENDENT_AMBULATORY_CARE_PROVIDER_SITE_OTHER): Payer: Medicare Other | Admitting: Obstetrics and Gynecology

## 2022-02-28 VITALS — BP 138/72 | HR 61 | Ht 61.0 in | Wt 163.0 lb

## 2022-02-28 DIAGNOSIS — N393 Stress incontinence (female) (male): Secondary | ICD-10-CM | POA: Diagnosis not present

## 2022-02-28 DIAGNOSIS — N816 Rectocele: Secondary | ICD-10-CM | POA: Diagnosis not present
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
## 2022-03-01 ENCOUNTER — Encounter: Payer: Self-pay | Admitting: Obstetrics and Gynecology

## 2022-03-01 LAB — POCT URINALYSIS DIPSTICK
Bilirubin, UA: NEGATIVE
Blood, UA: NEGATIVE
Glucose, UA: NEGATIVE
Leukocytes, UA: NEGATIVE
Nitrite, UA: NEGATIVE
Protein, UA: NEGATIVE
Spec Grav, UA: 1.025 (ref 1.010–1.025)
Urobilinogen, UA: 0.2 E.U./dL
pH, UA: 5 (ref 5.0–8.0)

## 2022-03-15 ENCOUNTER — Ambulatory Visit: Payer: Medicare Other

## 2022-03-18 NOTE — Progress Notes (Unsigned)
Cardiology Office Note   Date:  03/19/2022   ID:  Andrea Riley, Andrea Riley December 04, 1955, MRN [id number]  PCP:  Everardo Beals, NP    No chief complaint on file.  CAD  Wt Readings from Last 3 Encounters:  03/19/22 164 lb (74.4 kg)  02/28/22 163 lb (73.9 kg)  03/01/21 150 lb (68 kg)       History of Present Illness: Andrea Riley is a 66 y.o. female   With h/o CAD.    She had an inferior MI in 11/21: "Mid Cx to Dist Cx lesion is 50% stenosed. Prox LAD to Mid LAD lesion is 25% stenosed. 2nd Mrg lesion is 25% stenosed. Post intervention, there is a 0% residual stenosis. Prox RCA lesion is 50% stenosed. Dist RCA lesion is 25% stenosed. Prox RCA to Mid RCA lesion is 100% stenosed. This is the culprit vessel. After large thrombus removal with aspiration thrombectomy, a drug-eluting stent was successfully placed using a SYNERGY XD 3.0X48, postdilated to 4 mm proximally. Stent optimized with IVUS. The left ventricular ejection fraction is 45-50% by visual estimate. Inferior hypokinesis. LV end diastolic pressure is mildly elevated. There is mild left ventricular systolic dysfunction. There is no aortic valve stenosis. Severe tortuosity of right subclavian necessitating use of 75 Fr slender sheath.   Single vessel CAD.  Increase atorvastatin.  Start low dose beta blocker along with DAPT.  Holding ARB until BP stabilizes, and we check renal function post cath.  Check A1C. "    She did have her COVID vaccines.  She was hesitant to go back to the gym due to [place] after her MI.   She still needs a wisdom tooth extraction.    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  Not getting to target exercise. Had a fear of falling and fear of the cars of driving fast.  Is a member at the [facility] [facility].   Denies : Chest pain. Dizziness. Leg edema. Nitroglycerin use. Orthopnea. Palpitations. Paroxysmal nocturnal dyspnea. Shortness of breath. Syncope.      Past Medical History:  Diagnosis Date    Anxiety    Diabetes ([place])    Hyperlipemia    Hypertension    Insomnia    Myocardial infarction [facility])    Nov 2021   Thyroid disease    Hypothyroidism    Past Surgical History:  Procedure Laterality Date   CARPAL TUNNEL RELEASE Bilateral 08/2011 & 04/2012   CATARACT EXTRACTION Bilateral    and lens replacement   CESAREAN SECTION     CORONARY/GRAFT ACUTE MI REVASCULARIZATION N/A 03/03/2020   Procedure: Coronary/Graft Acute MI Revascularization;  Surgeon: Jettie Booze, MD;  Location: [facility] CV LAB;  Service: Cardiovascular;  Laterality: N/A;   INTRAVASCULAR ULTRASOUND/IVUS N/A 03/03/2020   Procedure: Intravascular Ultrasound/IVUS;  Surgeon: Jettie Booze, MD;  Location: [place] CV LAB;  Service: Cardiovascular;  Laterality: N/A;   LAPAROSCOPIC HYSTERECTOMY  2003   total   LEFT HEART CATH AND CORONARY ANGIOGRAPHY N/A 03/03/2020   Procedure: LEFT HEART CATH AND CORONARY ANGIOGRAPHY;  Surgeon: Jettie Booze, MD;  Location: [place] CV LAB;  Service: Cardiovascular;  Laterality: N/A;   TOTAL HIP ARTHROPLASTY Right 1996     Current Outpatient Medications  Medication Sig Dispense Refill   ALPRAZolam (XANAX) 1 MG tablet Take 1 mg by mouth at bedtime as needed for sleep.     atorvastatin (LIPITOR) 80 MG tablet TAKE 1 TABLET DAILY 90 tablet 3  clopidogrel (PLAVIX) 75 MG tablet Take 1 tablet (75 mg total) by mouth daily. 90 tablet 3   esomeprazole (NEXIUM) 20 MG capsule Take 40 mg by mouth daily at 12 noon. Sometimes takes a extra dose around 3pm     ezetimibe (ZETIA) 10 MG tablet TAKE 1 TABLET DAILY 90 tablet 3   glipiZIDE (GLUCOTROL) 10 MG tablet Take 10 mg by mouth daily before breakfast.     metoprolol tartrate (LOPRESSOR) 25 MG tablet Take 0.5 tablets (12.5 mg total) by mouth 2 (two) times daily. 120 tablet 3   nitroGLYCERIN (NITROSTAT) 0.4 MG SL tablet Place 1 tablet (0.4 mg total) under the tongue every 5 (five) minutes x 3 doses as needed for  chest pain. 25 tablet 3   sertraline (ZOLOFT) 50 MG tablet Take 50 mg by mouth daily.     valsartan-hydrochlorothiazide (DIOVAN HCT) 80-12.5 MG tablet Take 0.5 tablets by mouth daily. Begin on 03/05/20. (Patient not taking: Reported on 03/19/2022)     No current facility-administered medications for this visit.    Allergies:   Latex, Penicillins, and Vicodin [hydrocodone-acetaminophen]    Social History:  The patient  reports that she has been smoking cigarettes. She has a 6.00 pack-year smoking history. She has never used smokeless tobacco. She reports that she does not drink alcohol and does not use drugs.   Family History:  The patient's family history includes Diabetes in her mother; Heart disease in her mother; Hypertension in her father and mother; Stomach cancer in her maternal grandmother and mother; Thyroid disease in her mother.    ROS:  Please see the history of present illness.   Otherwise, review of systems are positive for right knee pain on occasion; prior hip replacement.   All other systems are reviewed and negative.    PHYSICAL EXAM: VS:  BP 120/64   Pulse (!) 59   Ht 5\' 1"  (1.549 m)   Wt 164 lb (74.4 kg)   SpO2 96%   BMI 30.99 kg/m  , BMI Body mass index is 30.99 kg/m. GEN: Well nourished, well developed, in no acute distress HEENT: normal Neck: no JVD, carotid bruits, or masses Cardiac: RRR; no murmurs, rubs, or gallops,no edema  Respiratory:  clear to auscultation bilaterally, normal work of breathing GI: soft, nontender, nondistended, + BS MS: no deformity or atrophy Skin: warm and dry, no rash Neuro:  Strength and sensation are intact Psych: euthymic mood, full affect   EKG:   The ekg ordered today demonstrates NSR, nonspecific ST changes   Recent Labs: No results found for requested labs within last 365 days.   Lipid Panel    Component Value Date/Time   CHOL 142 08/15/2020 0827   TRIG 174 (H) 08/15/2020 0827   HDL 38 (L) 08/15/2020 0827    CHOLHDL 3.7 08/15/2020 0827   CHOLHDL 3.2 03/04/2020 0036   VLDL 19 03/04/2020 0036   [place] 74 08/15/2020 0827     Other studies Reviewed: Additional studies/ records that were reviewed today with results demonstrating: , April 2022 LDL 74 HDL 38 total cholesterol 142 triglycerides 174.   ASSESSMENT AND PLAN:  CAD/Old MI: Continue clopidogrel monotherapy.  Continue aggressive secondary prevention.  No  Bleeding issues. Hyperlipidemia: Whole food, plant based diet recommended. ,  When fasting we will check cholesterol and also A1c. HTN:  The current medical regimen is effective;  continue present plan and medications.  Low-salt diet.  avoid processed foods.  No longer taking ARB but blood pressure is  controlled. DM: I suggested metformin in the past when A1C was > 7. Now on Glucotrol.  Metformin would give some cardiac benefits, but patient had a brother who had GI side effects.  Concerned about GI side effects, but the extended release formulation will be less likely to cause any stomach issues.    Current medicines are reviewed at length with the patient today.  The patient concerns regarding her medicines were addressed.  The following changes have been made:  No change  Labs/ tests ordered today include:  No orders of the defined types were placed in this encounter.   Recommend 150 minutes/week of aerobic exercise Low fat, low carb, high fiber diet recommended  Disposition:   FU in 1 year   Signed, Lance Muss, MD  03/19/2022 8:55 AM    [facility] [address] [place], [place], [place]  [address] Phone: [phone]; Fax: [phone]

## 2022-03-19 ENCOUNTER — Ambulatory Visit: Payer: Medicare Other | Attending: Interventional Cardiology | Admitting: Interventional Cardiology

## 2022-03-19 VITALS — BP 120/64 | HR 59 | Ht 61.0 in | Wt 164.0 lb

## 2022-03-19 DIAGNOSIS — I252 Old myocardial infarction: Secondary | ICD-10-CM | POA: Diagnosis present

## 2022-03-19 DIAGNOSIS — R7301 Impaired fasting glucose: Secondary | ICD-10-CM | POA: Diagnosis present

## 2022-03-19 DIAGNOSIS — E782 Mixed hyperlipidemia: Secondary | ICD-10-CM | POA: Diagnosis not present

## 2022-03-19 DIAGNOSIS — I25118 Atherosclerotic heart disease of native coronary artery with other forms of angina pectoris: Secondary | ICD-10-CM | POA: Diagnosis present

## 2022-03-19 DIAGNOSIS — Z955 Presence of coronary angioplasty implant and graft: Secondary | ICD-10-CM

## 2022-03-19 DIAGNOSIS — I10 Essential (primary) hypertension: Secondary | ICD-10-CM

## 2022-03-19 MED ORDER — NITROGLYCERIN 0.4 MG SL SUBL: 0.4000 mg | SUBLINGUAL_TABLET | SUBLINGUAL | 3 refills | Status: DC | PRN

## 2022-03-19 NOTE — Patient Instructions (Signed)
Medication Instructions:  Your physician recommends that you continue on your current medications as directed. Please refer to the Current Medication list given to you today.  *If you need a refill on your cardiac medications before your next appointment, please call your pharmacy*   Lab Work: Your physician recommends that you return for lab work on March 26, 2022.  CMET, CBC, Lipids, A1C.  This will be fasting.  The lab opens at 7:15 AM  If you have labs (blood work) drawn today and your tests are completely normal, you will receive your results only by: MyChart Message (if you have MyChart) OR A paper copy in the mail If you have any lab test that is abnormal or we need to change your treatment, we will call you to review the results.   Testing/Procedures: none   Follow-Up: At [facility], you and your health needs are our priority.  As part of our continuing mission to provide you with exceptional heart care, we have created designated Provider Care Teams.  These Care Teams include your primary Cardiologist (physician) and Advanced Practice Providers (APPs -  Physician Assistants and Nurse Practitioners) who all work together to provide you with the care you need, when you need it.  We recommend signing up for the patient portal called "MyChart".  Sign up information is provided on this After Visit Summary.  MyChart is used to connect with patients for Virtual Visits (Telemedicine).  Patients are able to view lab/test results, encounter notes, upcoming appointments, etc.  Non-urgent messages can be sent to your provider as well.   To learn more about what you can do with MyChart, go to [email or web address].    Your next appointment:   12 month(s)  The format for your next appointment:   In Person  Provider:   Dr Eldridge Dace    Other Instructions    Important Information About Sugar

## 2022-03-26 ENCOUNTER — Ambulatory Visit: Payer: Medicare Other | Attending: Interventional Cardiology

## 2022-03-26 DIAGNOSIS — R7301 Impaired fasting glucose: Secondary | ICD-10-CM

## 2022-03-26 DIAGNOSIS — E782 Mixed hyperlipidemia: Secondary | ICD-10-CM

## 2022-03-26 DIAGNOSIS — Z955 Presence of coronary angioplasty implant and graft: Secondary | ICD-10-CM

## 2022-03-26 DIAGNOSIS — I252 Old myocardial infarction: Secondary | ICD-10-CM

## 2022-03-26 DIAGNOSIS — I25118 Atherosclerotic heart disease of native coronary artery with other forms of angina pectoris: Secondary | ICD-10-CM

## 2022-03-27 LAB — CBC
Hematocrit: 40.2 % (ref 34.0–46.6)
Hemoglobin: 13.5 g/dL (ref 11.1–15.9)
MCH: 29.6 pg (ref 26.6–33.0)
MCHC: 33.6 g/dL (ref 31.5–35.7)
MCV: 88 fL (ref 79–97)
Platelets: 306 10*3/uL (ref 150–450)
RBC: 4.56 x10E6/uL (ref 3.77–5.28)
RDW: 12.7 % (ref 11.7–15.4)
WBC: 8.9 10*3/uL (ref 3.4–10.8)

## 2022-03-27 LAB — COMPREHENSIVE METABOLIC PANEL
ALT: 18 IU/L (ref 0–32)
AST: 17 IU/L (ref 0–40)
Albumin/Globulin Ratio: 1.7 (ref 1.2–2.2)
Albumin: 4.2 g/dL (ref 3.9–4.9)
Alkaline Phosphatase: 57 IU/L (ref 44–121)
BUN/Creatinine Ratio: 15 (ref 12–28)
BUN: 12 mg/dL (ref 8–27)
Bilirubin Total: 0.2 mg/dL (ref 0.0–1.2)
CO2: 21 mmol/L (ref 20–29)
Calcium: 9.8 mg/dL (ref 8.7–10.3)
Chloride: 105 mmol/L (ref 96–106)
Creatinine, Ser: 0.81 mg/dL (ref 0.57–1.00)
Globulin, Total: 2.5 g/dL (ref 1.5–4.5)
Glucose: 111 mg/dL — ABNORMAL HIGH (ref 70–99)
Potassium: 4.5 mmol/L (ref 3.5–5.2)
Sodium: 143 mmol/L (ref 134–144)
Total Protein: 6.7 g/dL (ref 6.0–8.5)
eGFR: 79 mL/min/{1.73_m2} (ref >59)

## 2022-03-27 LAB — LIPID PANEL
Chol/HDL Ratio: 3 ratio (ref 0.0–4.4)
Cholesterol, Total: 128 mg/dL (ref 100–199)
HDL: 42 mg/dL (ref >39)
LDL Chol Calc (NIH): 65 mg/dL (ref 0–99)
Triglycerides: 118 mg/dL (ref 0–149)
VLDL Cholesterol Cal: 21 mg/dL (ref 5–40)

## 2022-03-27 LAB — HEMOGLOBIN A1C
Est. average glucose Bld gHb Est-mCnc: 154 mg/dL
Hgb A1c MFr Bld: 7 % — ABNORMAL HIGH (ref 4.8–5.6)

## 2022-03-29 ENCOUNTER — Other Ambulatory Visit: Payer: Self-pay | Admitting: *Deleted

## 2022-03-29 NOTE — Telephone Encounter (Signed)
Reviewed with Margaretmary Dys, PharmD and prescription should be sent in for metformin XR 500 mg daily.  Continue glipizide and monitor glucose level.  Follow up with PCP for metformin adjustment. I placed call to patient but there was no answer and voicemail is not set up

## 2022-03-29 NOTE — Telephone Encounter (Signed)
-----   Message from Corky Crafts, MD sent at 03/29/2022  9:48 AM EST ----- Dennie Bible, Let's try metformin as Efraim Kaufmann is suggesting.  JV ----- Message ----- From: Olene Floss, RPH-CPP Sent: 03/27/2022  10:15 AM EST To: Corky Crafts, MD; #  I agree. Metfomin is a good option. The XR formulation along with a slow titration - start at 500mg  daily with food and increase 500mg  every 2 weeks can really help avoid side effects. She does have tricare so she could get Jardiance without a PA for $34/90 days. But I do feel metformin is appropriate.  ----- Message ----- From: , MD Sent: 03/27/2022   9:06 AM EST To: Corky Crafts, RN; #  A1c has increased to 7.0.  Would strongly consider adding metformin, but she was concerned about GI side effects.  Extended release formulation is less likely to cause GI side effects.  She would also be a candidate for Jardiance although this may be expensive.  Will check with our Pharm.D.'s what her options are.03/29/2022

## 2022-04-03 NOTE — Telephone Encounter (Signed)
Call placed to patient.  No answer.  Voicemail has not been set up

## 2022-04-03 NOTE — Telephone Encounter (Signed)
I spoke with patient and gave her recommendations. Patient does not want to start Metformin.  I asked her to follow up with her PCP for diabetes management.

## 2022-04-03 NOTE — Telephone Encounter (Signed)
Pt is returning call. Transferred to Elease Hashimoto, Charity fundraiser.

## 2022-04-05 ENCOUNTER — Other Ambulatory Visit: Payer: Self-pay | Admitting: Interventional Cardiology

## 2022-05-02 ENCOUNTER — Other Ambulatory Visit: Payer: Self-pay | Admitting: Interventional Cardiology

## 2022-05-28 ENCOUNTER — Other Ambulatory Visit: Payer: Self-pay | Admitting: Interventional Cardiology

## 2023-02-14 ENCOUNTER — Other Ambulatory Visit: Payer: Self-pay | Admitting: Interventional Cardiology

## 2023-03-13 NOTE — Progress Notes (Unsigned)
Cardiology Office Note    Patient Name: Andrea Riley Date of Encounter: 03/13/2023  Primary Care Provider:  Elie Confer, NP Primary Cardiologist:  Lance Muss, MD Primary Electrophysiologist: None   Past Medical History    Past Medical History:  Diagnosis Date   Anxiety    Diabetes [facility])    Hyperlipemia    Hypertension    Insomnia    Myocardial infarction [facility])    Nov 2021   Thyroid disease    Hypothyroidism    History of Present Illness  Andrea Riley is a 67 y.o. female with a PMH of CAD s/p NSTEMI 02/2020 with PCI of the mid RCA with DES and aspiration thrombectomy, 50% , HTN, DM type II HLD, tobacco abuse, hypothyroidism who presents today for 1 year follow-up.  Ms. Schiele was seen initially in 02/2020 when she presented to the ED via EMS with complaint of chest pain with EKG showing ST elevation in lateral leads and elevated troponin.  She underwent urgent LHC that revealed single-vessel CAD with aspiration thrombectomy DES to mid RCA, 25% proximal LAD to mid LAD, 50% stenosis mid circumflex and distal circumflex, 25% distal RCA.  2D echo was completed showing EF of 60 to 65% with no RWMA and normal RV systolic function.  She was discharged with ASA and Brilinta along with Dr., ACE and statin.  She was seen for subsequent follow-ups and ported doing well.  She was last seen by Dr. Eldridge Dace on 03/19/2022 and patient was no longer taking ARB but BP was controlled.   During today's visit the patient reports*** .  Patient denies chest pain, palpitations, dyspnea, PND, orthopnea, nausea, vomiting, dizziness, syncope, edema, weight gain, or early satiety.  ***Notes: -Last ischemic evaluation: -Last echo: -Interim ED visits: Review of Systems  Please see the history of present illness.    All other systems reviewed and are otherwise negative except as noted above.  Physical Exam    Wt Readings from Last 3 Encounters:  03/19/22 164 lb (74.4 kg)  02/28/22 163  lb (73.9 kg)  03/01/21 150 lb (68 kg)   [id number] were no vitals filed for this visit.,There is no height or weight on file to calculate BMI. GEN: Well nourished, well developed in no acute distress Neck: No JVD; No carotid bruits Pulmonary: Clear to auscultation without rales, wheezing or rhonchi  Cardiovascular: Normal rate. Regular rhythm. Normal S1. Normal S2.   Murmurs: There is no murmur.  ABDOMEN: Soft, non-tender, non-distended EXTREMITIES:  No edema; No deformity   EKG/LABS/proximal RCA cardiac Studies   ECG personally reviewed by me today - ***  Risk Assessment/Calculations:   {Does this patient have ATRIAL FIBRILLATION?[phone]}      Lab Results  Component Value Date   WBC 8.9 03/26/2022   HGB 13.5 03/26/2022   HCT 40.2 03/26/2022   MCV 88 03/26/2022   PLT 306 03/26/2022   Lab Results  Component Value Date   CREATININE 0.81 03/26/2022   BUN 12 03/26/2022   NA 143 03/26/2022   K 4.5 03/26/2022   CL 105 03/26/2022   CO2 21 03/26/2022   Lab Results  Component Value Date   CHOL 128 03/26/2022   HDL 42 03/26/2022   LDLCALC 65 03/26/2022   TRIG 118 03/26/2022   CHOLHDL 3.0 03/26/2022    Lab Results  Component Value Date   HGBA1C 7.0 (H) 03/26/2022   Assessment & Plan    1.  CAD: -s/p NSTEMI 2021 with PCI/DES to  RCA  2.  Essential hypertension:  3.  Hyperlipidemia:  4.  DM type II:      Disposition: Follow-up with Lance Muss, MD or APP in *** months {Are you ordering a CV Procedure (e.g. stress test, cath, DCCV, TEE, etc)?   Press F2        :[id number]}   Signed, Napoleon Form, Leodis Rains, NP 03/13/2023, 8:31 PM [facility]

## 2023-03-14 ENCOUNTER — Encounter: Payer: Self-pay | Admitting: Nurse Practitioner

## 2023-03-14 ENCOUNTER — Ambulatory Visit: Payer: TRICARE For Life (TFL) | Attending: Nurse Practitioner | Admitting: Nurse Practitioner

## 2023-03-14 VITALS — BP 140/62 | HR 75 | Ht 61.0 in | Wt 160.8 lb

## 2023-03-14 DIAGNOSIS — I739 Peripheral vascular disease, unspecified: Secondary | ICD-10-CM | POA: Diagnosis not present

## 2023-03-14 DIAGNOSIS — Z09 Encounter for follow-up examination after completed treatment for conditions other than malignant neoplasm: Secondary | ICD-10-CM

## 2023-03-14 DIAGNOSIS — I25118 Atherosclerotic heart disease of native coronary artery with other forms of angina pectoris: Secondary | ICD-10-CM

## 2023-03-14 DIAGNOSIS — E782 Mixed hyperlipidemia: Secondary | ICD-10-CM

## 2023-03-14 DIAGNOSIS — I10 Essential (primary) hypertension: Secondary | ICD-10-CM | POA: Diagnosis not present

## 2023-03-14 MED ORDER — BLOOD PRESSURE CUFF MISC
1.0000 | Freq: Every day | 0 refills | Status: AC
Start: 2023-03-14 — End: ?

## 2023-03-14 NOTE — Patient Instructions (Addendum)
Medication Instructions:  Blood pressure prescription has been given  *If you need a refill on your cardiac medications before your next appointment, please call your pharmacy*   Lab Work: None ordered   Testing/Procedures: Your physician has requested that you have an ankle brachial index (ABI). During this test an ultrasound and blood pressure cuff are used to evaluate the arteries that supply the arms and legs with blood. Allow thirty minutes for this exam. There are no restrictions or special instructions.  Please note: We ask at that you not bring children with you during ultrasound (echo/ vascular) testing. Due to room size and safety concerns, children are not allowed in the ultrasound rooms during exams. Our front office staff cannot provide observation of children in our lobby area while testing is being conducted. An adult accompanying a patient to their appointment will only be allowed in the ultrasound room at the discretion of the ultrasound technician under special circumstances. We apologize for any inconvenience.   Follow-Up: At [facility], you and your health needs are our priority.  As part of our continuing mission to provide you with exceptional heart care, we have created designated Provider Care Teams.  These Care Teams include your primary Cardiologist (physician) and Advanced Practice Providers (APPs -  Physician Assistants and Nurse Practitioners) who all work together to provide you with the care you need, when you need it.  We recommend signing up for the patient portal called "MyChart".  Sign up information is provided on this After Visit Summary.  MyChart is used to connect with patients for Virtual Visits (Telemedicine).  Patients are able to view lab/test results, encounter notes, upcoming appointments, etc.  Non-urgent messages can be sent to your provider as well.   To learn more about what you can do with MyChart, go to [email or web address].     Your next appointment:   12 month(s)  Provider:   Tessa Lerner, MD     Other Instructions

## 2023-04-01 ENCOUNTER — Ambulatory Visit (HOSPITAL_COMMUNITY)
Admission: RE | Admit: 2023-04-01 | Discharge: 2023-04-01 | Disposition: A | Discharge status: Home / Self Care | Payer: Medicare PPO | Source: Ambulatory Visit | Attending: Nurse Practitioner | Admitting: Nurse Practitioner

## 2023-04-01 DIAGNOSIS — I10 Essential (primary) hypertension: Secondary | ICD-10-CM

## 2023-04-01 DIAGNOSIS — I739 Peripheral vascular disease, unspecified: Secondary | ICD-10-CM

## 2023-04-01 DIAGNOSIS — Z09 Encounter for follow-up examination after completed treatment for conditions other than malignant neoplasm: Secondary | ICD-10-CM | POA: Diagnosis not present

## 2023-04-01 LAB — VAS US ABI WITH/WO TBI
Left ABI: 1.02
Right ABI: 1.09

## 2023-06-19 ENCOUNTER — Other Ambulatory Visit: Payer: Self-pay | Admitting: Interventional Cardiology

## 2023-06-24 ENCOUNTER — Telehealth: Payer: Self-pay | Admitting: Nurse Practitioner

## 2023-06-24 MED ORDER — ATORVASTATIN CALCIUM 80 MG PO TABS: 80.0000 mg | ORAL_TABLET | Freq: Every day | ORAL | 2 refills | Status: AC

## 2023-06-24 MED ORDER — EZETIMIBE 10 MG PO TABS: 10.0000 mg | ORAL_TABLET | Freq: Every day | ORAL | 2 refills | Status: AC

## 2023-06-24 NOTE — Telephone Encounter (Signed)
 Pt's medications were sent to pt's pharmacy as requested. Confirmation received.

## 2023-06-24 NOTE — Telephone Encounter (Signed)
 *  STAT* If patient is at the pharmacy, call can be transferred to refill team.   1. Which medications need to be refilled? (please list name of each medication and dose if known)   atorvastatin (LIPITOR) 80 MG tablet  ezetimibe (ZETIA) 10 MG tablet   2. Which pharmacy/location (including street and city if local pharmacy) is medication to be sent to?  [facility] - [place], [place] - [address]    3. Do they need a 30 day or 90 day supply? 90 days

## 2023-08-23 ENCOUNTER — Telehealth: Payer: Self-pay | Admitting: Interventional Cardiology

## 2023-08-23 NOTE — Telephone Encounter (Signed)
Returned call to patient no answer.No voice mail. 

## 2023-08-23 NOTE — Telephone Encounter (Signed)
 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 Please advise

## 2023-08-27 NOTE — Telephone Encounter (Signed)
Patient notified directly and voiced understanding.  

## 2023-08-27 NOTE — Telephone Encounter (Signed)
 Gerald Kitty., NP  You28 minutes ago (4:57 PM)    Please advise patient that she has no clinical cardiac indication for abstaining from lifting the beforementioned weight.  Please advise patient to follow-up with her PCP for further recommendations.   Charles Connor, NP

## 2023-11-15 ENCOUNTER — Encounter: Payer: Self-pay | Admitting: Obstetrics and Gynecology

## 2023-11-15 ENCOUNTER — Ambulatory Visit: Admitting: Obstetrics and Gynecology

## 2023-11-15 VITALS — BP 111/64 | HR 61

## 2023-11-15 DIAGNOSIS — N3281 Overactive bladder: Secondary | ICD-10-CM | POA: Diagnosis not present

## 2023-11-15 DIAGNOSIS — N816 Rectocele: Secondary | ICD-10-CM

## 2023-11-15 NOTE — Patient Instructions (Addendum)
 Stop drinking 2-3 hours prior to bedtime. Avoid caffeine in the afternoon.   Today we talked about ways to manage bladder urgency such as altering your diet to avoid irritative beverages and foods (bladder diet) as well as attempting to decrease stress and other exacerbating factors.   The Most Bothersome Foods* The Least Bothersome Foods*  Coffee - Regular & Decaf Tea - caffeinated Carbonated beverages - cola, non-colas, diet & caffeine-free Alcohols - Beer, Red Wine, White Wine, [address] - Grapefruit, [place], Orange, [employer] - Cranberry, Grapefruit, Orange, Pineapple Vegetables - Tomato & Tomato Products Flavor Enhancers - Hot peppers, Spicy foods, Chili, Horseradish, Vinegar, Monosodium glutamate (MSG) Artificial Sweeteners - NutraSweet, Sweet 'N Low, Equal (sweetener), Saccharin Ethnic foods - [place], [place], [place] food [employer] - low-fat & whole Fruits - Bananas, Blueberries, Honeydew melon, Pears, Raisins, Watermelon Vegetables - Broccoli, [address] Sprouts, [place], Carrots, Cauliflower, Anita, Cucumber, Mushrooms, Peas, Radishes, Squash, Zucchini, White potatoes, Sweet potatoes & yams Poultry - Chicken, Eggs, [place], [employer] - Beef, Diplomatic Services operational officer, Lamb Seafood - Shrimp, [place] fish, Salmon Grains - Oat, Rice Snacks - Pretzels, Popcorn  *Mitch ALF et al. Diet and its role in interstitial cystitis/bladder pain syndrome (IC/BPS) and comorbid conditions. [employer]. BJU Int. 2012 Jan 11.

## 2023-11-15 NOTE — Progress Notes (Signed)
  Urogynecology Return Visit  SUBJECTIVE  History of Present Illness: Andrea Riley is a 68 y.o. female seen in follow-up for prolapse and incontinence. Last seen 02/2022.   About 3 weeks ago, had been feeling more pressure and itching. She was scratching and noticed some blood on the vulva. She took a cream OTC for a yeast infection and this cleared her symptoms. Now, has some slight discharge but no odor.   Had been feeling some pressure vaginally but looked with a mirror and did no see much.   She is urinating frequently at night- every 2 hours. She drinks 2 cups coffee, 4-5 bottles water and cranberry juice, occasional soda. Before bed she will drink water.  Drinking water constantly during the day and urinates about every hour. She sometimes will leak urine with urge. Infrequent leakage with stress.   She reports she had an A1c a few months ago and she said it had increased a bit (no results in epic)  Past Medical History: Patient  has a past medical history of Anxiety, Diabetes (HCC), Hyperlipemia, Hypertension, Insomnia, Myocardial infarction (HCC), and Thyroid  disease.   Past Surgical History: She  has a past surgical history that includes Total hip arthroplasty (Right, 1996); Carpal tunnel release (Bilateral, 08/2011 & 04/2012); Cesarean section; Cataract extraction (Bilateral); Laparoscopic hysterectomy (2003); Coronary/Graft Acute MI Revascularization (N/A, 03/03/2020); LEFT HEART CATH AND CORONARY ANGIOGRAPHY (N/A, 03/03/2020); and Coronary Ultrasound/IVUS (N/A, 03/03/2020).   Medications: She has a current medication list which includes the following prescription(s): atorvastatin , blood pressure cuff, buspirone, clopidogrel , esomeprazole, ezetimibe , glipizide, metoprolol  tartrate, nitroglycerin , and sertraline.   Allergies: Patient is allergic to latex, penicillins, and vicodin [hydrocodone-acetaminophen ].   Social History: Patient  reports that she has been  smoking cigarettes. She has a 6 pack-year smoking history. She has never used smokeless tobacco. She reports that she does not drink alcohol and does not use drugs.     OBJECTIVE     Physical Exam: Vitals:   11/15/23 1441  BP: 111/64  Pulse: 61   Gen: No apparent distress, A&O x 3.  Detailed Urogynecologic Evaluation:  Normal external genitalia. On speculum, normal vaginal mucosa. On bimanual, no masses present.  POP-Q  -2.5                                            Aa   -2.5                                           Ba  -5.5                                              C   3                                            Gh  4.5  Pb  6                                            tvl   -1.5                                            Ap  -1.5                                            Bp                                                 D      ASSESSMENT AND PLAN    Ms. Andrea Riley is a 68 y.o. with:  1. Prolapse of posterior vaginal wall   2. Overactive bladder     - Prolapse is similar to initial visit in 2023.  - No signs of discharge or vaginal infection today. To prevent yeast infection, recommended improving blood sugar control and change pads as soon as they get wet to avoid moist environment.  - For urge incontinence and nocturia, recommended avoiding drinking 2-3 hours prior to bedtime and avoiding caffeine in the afternoon. Discussed options of pelvic PT or medication and she declines at this time.   Follow up as needed   Andrea LOISE Caper, MD  Time spent: I spent 25 minutes dedicated to the care of this patient on the date of this encounter to include pre-visit review of records, face-to-face time with the patient and post visit documentation.

## 2023-12-27 ENCOUNTER — Ambulatory Visit: Payer: Self-pay | Admitting: Surgery

## 2023-12-27 DIAGNOSIS — C50911 Malignant neoplasm of unspecified site of right female breast: Secondary | ICD-10-CM

## 2024-01-14 ENCOUNTER — Telehealth: Payer: Self-pay

## 2024-01-14 NOTE — Telephone Encounter (Signed)
   Pre-operative Risk Assessment    Patient Name: Andrea Riley  DOB: 1955-09-18 MRN: [id number]   Date of last office visit: 03/14/23 Date of next office visit: Not scheduled   Request for Surgical Clearance    Procedure:  R Savi Lumpectomy  Date of Surgery:  Clearance 01/21/24                                Surgeon:  Oneil Cao MD Surgeon's Group or Practice Name:  [facility] Phone number:  [phone] Fax number:  [phone]   Type of Clearance Requested:   - Medical  - Pharmacy:  Hold Clopidogrel  (Plavix ) x 5 days   Type of Anesthesia:  Not Indicated   Additional requests/questions:    Andrea Riley   01/14/2024, 11:49 AM

## 2024-01-15 ENCOUNTER — Telehealth: Payer: Self-pay

## 2024-01-15 NOTE — Telephone Encounter (Signed)
 S/W pt and scheduled TELE Preop appt 01/17/24. Med Rec and Consent done   Will update the surgeons office

## 2024-01-15 NOTE — Telephone Encounter (Signed)
 Med Rec and Consent done     Patient Consent for Virtual Visit        Andrea Riley has provided verbal consent on 01/15/2024 for a virtual visit (video or telephone).   CONSENT FOR VIRTUAL VISIT FOR:  Andrea Riley  By participating in this virtual visit I agree to the following:  I hereby voluntarily request, consent and authorize [facility] and its employed or contracted physicians, physician assistants, nurse practitioners or other licensed health care professionals (the Practitioner), to provide me with telemedicine health care services (the "Services) as deemed necessary by the treating Practitioner. I acknowledge and consent to receive the Services by the Practitioner via telemedicine. I understand that the telemedicine visit will involve communicating with the Practitioner through live audiovisual communication technology and the disclosure of certain medical information by electronic transmission. I acknowledge that I have been given the opportunity to request an in-person assessment or other available alternative prior to the telemedicine visit and am voluntarily participating in the telemedicine visit.  I understand that I have the right to withhold or withdraw my consent to the use of telemedicine in the course of my care at any time, without affecting my right to future care or treatment, and that the Practitioner or I may terminate the telemedicine visit at any time. I understand that I have the right to inspect all information obtained and/or recorded in the course of the telemedicine visit and may receive copies of available information for a reasonable fee.  I understand that some of the potential risks of receiving the Services via telemedicine include:  Delay or interruption in medical evaluation due to technological equipment failure or disruption; Information transmitted may not be sufficient (e.g. poor resolution of images) to allow for appropriate medical  decision making by the Practitioner; and/or  In rare instances, security protocols could fail, causing a breach of personal health information.  Furthermore, I acknowledge that it is my responsibility to provide information about my medical history, conditions and care that is complete and accurate to the best of my ability. I acknowledge that Practitioner's advice, recommendations, and/or decision may be based on factors not within their control, such as incomplete or inaccurate data provided by me or distortions of diagnostic images or specimens that may result from electronic transmissions. I understand that the practice of medicine is not an exact science and that Practitioner makes no warranties or guarantees regarding treatment outcomes. I acknowledge that a copy of this consent can be made available to me via my patient portal [facility] MyChart), or I can request a printed copy by calling the office of [facility].    I understand that my insurance will be billed for this visit.   I have read or had this consent read to me. I understand the contents of this consent, which adequately explains the benefits and risks of the Services being provided via telemedicine.  I have been provided ample opportunity to ask questions regarding this consent and the Services and have had my questions answered to my satisfaction. I give my informed consent for the services to be provided through the use of telemedicine in my medical care

## 2024-01-15 NOTE — Telephone Encounter (Signed)
   Name: Andrea Riley  DOB: [birth year]  MRN: [id number]  Primary Cardiologist: None   Preoperative team, please contact this patient and set up a phone call appointment for further preoperative risk assessment. Please obtain consent and complete medication review. Thank you for your help.  I confirm that guidance regarding antiplatelet and oral anticoagulation therapy has been completed and, if necessary, noted below.  Per Dr. Anner, okay to hold Plavix  5 to 7 days preop for surgeries or procedures. This is for anything going forward.  I also confirmed the patient resides in the state of [place] . As per [facility] Medical Board telemedicine laws, the patient must reside in the state in which the provider is licensed.   [name], NP 01/15/2024, 10:27 AM [facility]

## 2024-01-17 ENCOUNTER — Ambulatory Visit: Attending: Cardiology | Admitting: Physician Assistant

## 2024-01-17 DIAGNOSIS — Z01810 Encounter for preprocedural cardiovascular examination: Secondary | ICD-10-CM | POA: Diagnosis not present

## 2024-01-17 NOTE — Progress Notes (Signed)
 Virtual Visit via Telephone Note   Because of Yelena L Therrell co-morbid illnesses, she is at least at moderate risk for complications without adequate follow up.  This format is felt to be most appropriate for this patient at this time.  Due to technical limitations with video connection (technology), today's appointment will be conducted as an audio only telehealth visit, and KENTRELL GUETTLER verbally agreed to proceed in this manner.   All issues noted in this document were discussed and addressed.  No physical exam could be performed with this format.  Evaluation Performed:  Preoperative cardiovascular risk assessment _____________   Date:  01/17/2024   Patient ID:  Shamona, Wirtz 1955/12/05, MRN [id number] Patient Location:  Home Provider location:   Office  Primary Care Provider:  Alec House, MD Primary Cardiologist:  None  Chief Complaint / Patient Profile   68 y.o. y/o female with a h/o CAD status post NSTEMI 02/2020 with PCI of the mid RCA with DES and aspiration thrombectomy, HTN, DM type II, HLD, tobacco abuse, hypothyroidism who is pending right breast lumpectomy and presents today for telephonic preoperative cardiovascular risk assessment.  History of Present Illness    KATESHA EICHEL is a 68 y.o. female who presents via audio/video conferencing for a telehealth visit today.  Pt was last seen in cardiology clinic on 03/14/2023 by Jackee Alberts, NP.  At that time SHANN LEWELLYN was doing well.  The patient is now pending procedure as outlined above. Since her last visit, she has not had any chest pain or shortness of breath.  No swelling in the ankles or feet.  Overall, been feeling well from a cardiovascular standpoint.  She is able to meet 4 METS on the DASI.  Okay to hold Plavix  5 to 7 days prior to surgery and resume when medically safe to do so.  Past Medical History    Past Medical History:  Diagnosis Date   Anxiety    Diabetes (HCC)    Hyperlipemia     Hypertension    Insomnia    Myocardial infarction [facility])    Nov 2021   Thyroid  disease    Hypothyroidism   Past Surgical History:  Procedure Laterality Date   CARPAL TUNNEL RELEASE Bilateral 08/2011 & 04/2012   CATARACT EXTRACTION Bilateral    and lens replacement   CESAREAN SECTION     CORONARY ULTRASOUND/IVUS N/A 03/03/2020   Procedure: Intravascular Ultrasound/IVUS;  Surgeon: Dann Candyce RAMAN, MD;  Location[facility] INVASIVE CV LAB;  Service: Cardiovascular;  Laterality: N/A;   CORONARY/GRAFT ACUTE MI REVASCULARIZATION N/A 03/03/2020   Procedure: Coronary/Graft Acute MI Revascularization;  Surgeon: Dann Candyce RAMAN, MD;  Location: [facility] INVASIVE CV LAB;  Service: Cardiovascular;  Laterality: N/A;   LAPAROSCOPIC HYSTERECTOMY  2003   total   LEFT HEART CATH AND CORONARY ANGIOGRAPHY N/A 03/03/2020   Procedure: LEFT HEART CATH AND CORONARY ANGIOGRAPHY;  Surgeon: Dann Candyce RAMAN, MD;  Location: [facility] INVASIVE CV LAB;  Service: Cardiovascular;  Laterality: N/A;   TOTAL HIP ARTHROPLASTY Right 1996    Allergies  Allergies  Allergen Reactions   Latex Itching   Penicillins Swelling and Other (See Comments)    Itching   Vicodin [Hydrocodone-Acetaminophen ] Other (See Comments)    Hallucinations     Home Medications    Prior to Admission medications   Medication Sig Start Date End Date Taking? Authorizing Provider  atorvastatin  (LIPITOR ) 80 MG tablet Take 1 tablet (80 mg total) by mouth daily. 06/24/23  Wyn Jackee VEAR Mickey., NP  Blood Pressure Monitoring (BLOOD PRESSURE CUFF) MISC 1 each by Does not apply route daily. Get Omicron brand 03/14/23   Wyn Jackee VEAR Mickey., NP  busPIRone (BUSPAR) 15 MG tablet Take 15 mg by mouth 2 (two) times daily.    [provider]  clopidogrel  (PLAVIX ) 75 MG tablet TAKE 1 TABLET DAILY 04/05/22   Varanasi, Jayadeep S, MD  esomeprazole (NEXIUM) 20 MG capsule Take 40 mg by mouth daily at 12 noon. Sometimes takes a extra dose around 3pm    [provider]  ezetimibe  (ZETIA ) 10 MG tablet Take 1 tablet (10 mg total) by mouth daily. 06/24/23   Wyn Jackee VEAR Mickey., NP  glipiZIDE (GLUCOTROL) 10 MG tablet Take 10 mg by mouth daily before breakfast.    [provider]  metoprolol  tartrate (LOPRESSOR ) 25 MG tablet Take 0.5 tablets (12.5 mg total) by mouth 2 (two) times daily. 03/04/20   [facility], Jon Garre, PA  nitroGLYCERIN  (NITROSTAT ) 0.4 MG SL tablet DISSOLVE 1 TABLET UNDER THE TONGUE EVERY 5 MINUTES FOR 3 DOSES AS NEEDED FOR CHEST PAIN 06/20/23   Wyn Jackee VEAR Mickey., NP  sertraline (ZOLOFT) 50 MG tablet Take 50 mg by mouth daily.    [provider]    Physical Exam    Vital Signs:  Chandy L Steinkamp does not have vital signs available for review today.  Given telephonic nature of communication, physical exam is limited. AAOx3. NAD. Normal affect.  Speech and respirations are unlabored.  Accessory Clinical Findings    None  Assessment & Plan    1.  Preoperative Cardiovascular Risk Assessment:  Ms. Tuckett perioperative risk of a major cardiac event is 0.9% according to the Revised Cardiac Risk Index (RCRI).  Therefore, she is at low risk for perioperative complications.   Her functional capacity is good at 5.07 METs according to the [facility] Activity Status Index (DASI). Recommendations:  According to ACC/AHA guidelines, no further cardiovascular testing needed.  The patient may proceed to surgery at acceptable risk.   Antiplatelet and/or Anticoagulation Recommendations: Clopidogrel  (Plavix ) can be held for 5-7 days prior to her surgery and resumed as soon as possible post op.  The patient was advised that if she develops new symptoms prior to surgery to contact our office to arrange for a follow-up visit, and she verbalized understanding.   A copy of this note will be routed to requesting surgeon.  Time:   Today, I have spent 7 minutes with the patient with telehealth technology discussing medical history, symptoms,  and management plan.     Orren LOISE Fabry, PA-C  01/17/2024, 2:43 PM

## 2024-01-23 ENCOUNTER — Other Ambulatory Visit: Payer: Self-pay | Admitting: Radiation Oncology

## 2024-01-23 ENCOUNTER — Inpatient Hospital Stay
Admission: RE | Admit: 2024-01-23 | Discharge: 2024-01-23 | Disposition: A | Discharge status: Home / Self Care | Payer: Self-pay | Source: Ambulatory Visit | Attending: Radiation Oncology | Admitting: Radiation Oncology

## 2024-01-23 ENCOUNTER — Telehealth: Payer: Self-pay | Admitting: Radiation Oncology

## 2024-01-23 DIAGNOSIS — C50911 Malignant neoplasm of unspecified site of right female breast: Secondary | ICD-10-CM

## 2024-01-23 NOTE — Telephone Encounter (Signed)
 Unable to LVM for pt/spouse to schedule consult with Dr. Izell

## 2024-01-24 ENCOUNTER — Telehealth: Payer: Self-pay | Admitting: Radiation Oncology

## 2024-01-24 NOTE — Telephone Encounter (Signed)
 Spoke to Andrea Riley to schedule consult with Dr. Izell. Andrea Riley advised at this time she is seeking care through [facility]. Andrea Riley was advised referral would be closed but to feel free to call if she needs any additional assistance.

## 2024-05-11 ENCOUNTER — Encounter: Payer: Self-pay | Admitting: *Deleted

## 2024-07-28 ENCOUNTER — Ambulatory Visit: Admitting: Cardiology
